# Patient Record
Sex: Female | Born: 1970 | Race: White | Hispanic: No | Marital: Married | Smoking: Never smoker
Health system: Southern US, Community
[De-identification: ages and names within clinical notes are randomized; demographics above are authoritative.]

## PROBLEM LIST (undated history)

## (undated) DIAGNOSIS — F419 Anxiety disorder, unspecified: Secondary | ICD-10-CM

## (undated) DIAGNOSIS — B002 Herpesviral gingivostomatitis and pharyngotonsillitis: Secondary | ICD-10-CM

## (undated) DIAGNOSIS — D649 Anemia, unspecified: Secondary | ICD-10-CM

## (undated) DIAGNOSIS — T7840XA Allergy, unspecified, initial encounter: Secondary | ICD-10-CM

## (undated) DIAGNOSIS — G4700 Insomnia, unspecified: Secondary | ICD-10-CM

## (undated) DIAGNOSIS — E669 Obesity, unspecified: Secondary | ICD-10-CM

## (undated) DIAGNOSIS — J45909 Unspecified asthma, uncomplicated: Secondary | ICD-10-CM

## (undated) HISTORY — DX: Insomnia, unspecified: G47.00

## (undated) HISTORY — DX: Anxiety disorder, unspecified: F41.9

## (undated) HISTORY — PX: CHOLECYSTECTOMY: SHX55

## (undated) HISTORY — PX: APPENDECTOMY: SHX54

## (undated) HISTORY — DX: Obesity, unspecified: E66.9

## (undated) HISTORY — DX: Herpesviral gingivostomatitis and pharyngotonsillitis: B00.2

## (undated) HISTORY — PX: BILATERAL SALPINGOOPHORECTOMY: SHX1223

## (undated) HISTORY — DX: Unspecified asthma, uncomplicated: J45.909

## (undated) HISTORY — PX: TONSILLECTOMY: SUR1361

## (undated) HISTORY — PX: ADENOIDECTOMY: SUR15

## (undated) HISTORY — PX: OTHER SURGICAL HISTORY: SHX169

## (undated) HISTORY — PX: COLONOSCOPY: SHX174

## (undated) HISTORY — DX: Allergy, unspecified, initial encounter: T78.40XA

---

## 2019-04-15 ENCOUNTER — Other Ambulatory Visit: Payer: Self-pay | Admitting: Obstetrics and Gynecology

## 2019-04-15 DIAGNOSIS — Z1231 Encounter for screening mammogram for malignant neoplasm of breast: Secondary | ICD-10-CM

## 2019-04-20 ENCOUNTER — Ambulatory Visit (HOSPITAL_COMMUNITY)
Admission: RE | Admit: 2019-04-20 | Discharge: 2019-04-20 | Disposition: A | Discharge status: Home / Self Care | Payer: BC Managed Care – PPO | Source: Ambulatory Visit | Attending: Physician Assistant | Admitting: Physician Assistant

## 2019-04-20 ENCOUNTER — Ambulatory Visit
Admission: EM | Admit: 2019-04-20 | Discharge: 2019-04-20 | Disposition: A | Discharge status: Home / Self Care | Payer: BC Managed Care – PPO

## 2019-04-20 ENCOUNTER — Ambulatory Visit
Admission: RE | Admit: 2019-04-20 | Discharge: 2019-04-20 | Disposition: A | Discharge status: Home / Self Care | Payer: BC Managed Care – PPO | Source: Ambulatory Visit | Attending: Obstetrics and Gynecology | Admitting: Obstetrics and Gynecology

## 2019-04-20 ENCOUNTER — Other Ambulatory Visit: Payer: Self-pay

## 2019-04-20 DIAGNOSIS — M79609 Pain in unspecified limb: Secondary | ICD-10-CM

## 2019-04-20 DIAGNOSIS — M25561 Pain in right knee: Secondary | ICD-10-CM | POA: Diagnosis present

## 2019-04-20 DIAGNOSIS — Z1231 Encounter for screening mammogram for malignant neoplasm of breast: Secondary | ICD-10-CM | POA: Insufficient documentation

## 2019-04-20 HISTORY — DX: Anemia, unspecified: D64.9

## 2019-04-20 MED ORDER — METHOCARBAMOL 500 MG PO TABS: 500.0000 mg | ORAL_TABLET | Freq: Two times a day (BID) | ORAL | 0 refills | Status: DC

## 2019-04-20 NOTE — Progress Notes (Signed)
Lower extremity venous has been completed.   Preliminary results in CV Proc.   Abram Sander 04/20/2019 4:10 PM

## 2019-04-20 NOTE — ED Provider Notes (Signed)
[facility]    CSN: [id number] Arrival date & time: 04/20/19  1100      History   Chief Complaint Chief Complaint  Patient presents with   Knee Pain    HPI Brittney [name] is a 49 y.o. female.   49 year old female comes in for 5 day history of atraumatic right knee pain. States pain is to the posterior right knee that radiates up the posterior thigh. Area is tender to palpation. States received J&J COVID vaccine 04/10/2019, saw today on the news about DVT from vaccine and wanted to make sure she did not have one.  Denies injury/trauma.  Denies swelling, erythema, warmth.  Denies chest pain, shortness of breath.  Denies hormone use/OCPs, long travels/immobility.  Denies recent surgeries.  Denies history of blood clots.  Never smoker.     Past Medical History:  Diagnosis Date   Anemia     There are no problems to display for this patient.   History reviewed. No pertinent surgical history.  OB History   No obstetric history on file.      Home Medications    Prior to Admission medications   Medication Sig Start Date End Date Taking? Authorizing Provider  loratadine (CLARITIN) 10 MG tablet Take 10 mg by mouth daily.   Yes [provider]  Multiple Vitamin (MULTIVITAMIN) capsule Take 1 capsule by mouth daily.   Yes [provider]  methocarbamol (ROBAXIN) 500 MG tablet Take 1 tablet (500 mg total) by mouth 2 (two) times daily. 04/20/19   Ok Edwards, PA-C    Family History History reviewed. No pertinent family history.  Social History Social History   Tobacco Use   Smoking status: Never Smoker   Smokeless tobacco: Never Used  Substance Use Topics   Alcohol use: Not Currently   Drug use: Never     Allergies   Peanut-containing drug products and Sulfa antibiotics   Review of Systems Review of Systems  Reason unable to perform ROS: See HPI as above.     Physical Exam Triage Vital Signs ED Triage Vitals  Enc Vitals Group      BP 04/20/19 1108 110/74     Pulse Rate 04/20/19 1108 80     Resp 04/20/19 1108 18     Temp 04/20/19 1108 98 F (36.7 C)     Temp Source 04/20/19 1108 Oral     SpO2 04/20/19 1108 98 %     Weight --      Height --      Head Circumference --      Peak Flow --      Pain Score 04/20/19 1116 4     Pain Loc --      Pain Edu? --      Excl. in [place]? --    No data found.  Updated Vital Signs BP 110/74 (BP Location: Left Arm)    Pulse 80    Temp 98 F (36.7 C) (Oral)    Resp 18    LMP 04/19/2019    SpO2 98%   Physical Exam Constitutional:      General: She is not in acute distress.    Appearance: Normal appearance. She is well-developed. She is not toxic-appearing or diaphoretic.  HENT:     Head: Normocephalic and atraumatic.  Eyes:     Conjunctiva/sclera: Conjunctivae normal.     Pupils: Pupils are equal, round, and reactive to light.  Pulmonary:     Effort: Pulmonary effort  is normal. No respiratory distress.     Comments: Speaking in full sentences without difficulty Musculoskeletal:     Cervical back: Normal range of motion and neck supple.     Comments: No swelling, erythema, warmth.  Tenderness to palpation of posterior right knee.  No cords felt.  Full range of motion of knee and hips.  Strength 5/5 BLE. Sensation intact.   Skin:    General: Skin is warm and dry.  Neurological:     Mental Status: She is alert and oriented to person, place, and time.      UC Treatments / Results  Labs (all labs ordered are listed, but only abnormal results are displayed) Labs Reviewed - No data to display  EKG   Radiology LE VENOUS  Result Date: 04/20/2019  Lower Venous DVTStudy Indications: Pain, and Swelling.  Comparison Study: no prior Performing Technologist: Abram Sander RVS  Examination Guidelines: A complete evaluation includes B-mode imaging, spectral Doppler, color Doppler, and power Doppler as needed of all accessible portions of each vessel. Bilateral testing is  considered an integral part of a complete examination. Limited examinations for reoccurring indications may be performed as noted. The reflux portion of the exam is performed with the patient in reverse Trendelenburg.  +---------+---------------+---------+-----------+----------+--------------+  RIGHT     Compressibility Phasicity Spontaneity Properties Thrombus Aging  +---------+---------------+---------+-----------+----------+--------------+  CFV       Full            Yes       Yes                                    +---------+---------------+---------+-----------+----------+--------------+  SFJ       Full                                                             +---------+---------------+---------+-----------+----------+--------------+  FV Prox   Full                                                             +---------+---------------+---------+-----------+----------+--------------+  FV Mid    Full                                                             +---------+---------------+---------+-----------+----------+--------------+  FV Distal Full                                                             +---------+---------------+---------+-----------+----------+--------------+  PFV       Full                                                             +---------+---------------+---------+-----------+----------+--------------+  POP       Full            Yes       Yes                                    +---------+---------------+---------+-----------+----------+--------------+  PTV       Full                                                             +---------+---------------+---------+-----------+----------+--------------+  PERO      Full                                                             +---------+---------------+---------+-----------+----------+--------------+   +----+---------------+---------+-----------+----------+--------------+   LEFT Compressibility Phasicity Spontaneity Properties Thrombus Aging  +----+---------------+---------+-----------+----------+--------------+  CFV  Full            Yes       Yes                                    +----+---------------+---------+-----------+----------+--------------+     Summary: RIGHT: - There is no evidence of deep vein thrombosis in the lower extremity.  - No cystic structure found in the popliteal fossa.  LEFT: - No evidence of common femoral vein obstruction.  *See table(s) above for measurements and observations.    Preliminary     Procedures Procedures (including critical care time)  Medications Ordered in UC Medications - No data to display  Initial Impression / Assessment and Plan / UC Course  I have reviewed the triage vital signs and the nursing notes.  Pertinent labs & imaging results that were available during my care of the patient were reviewed by me and considered in my medical decision making (see chart for details).    Well's score of 1. Will order DVT study. Otherwise, will treat symptomatically for possible strain/tendinitis. NSAIDs, muscle relaxant as directed. Return precautions given. Patient expresses understanding and agrees to plan. Patient discharged in stable condition pending DVT study appt this afternoon.  DVT study negative. Will proceed with above plan.  Final Clinical Impressions(s) / UC Diagnoses   Final diagnoses:  Posterior knee pain, right    ED Prescriptions    Medication Sig Dispense Auth. Provider   methocarbamol (ROBAXIN) 500 MG tablet Take 1 tablet (500 mg total) by mouth 2 (two) times daily. 20 tablet Ok Edwards, PA-C     PDMP not reviewed this encounter.   Ok Edwards, PA-C 04/20/19 1900

## 2019-04-20 NOTE — ED Notes (Signed)
Pt notified she has an appt at 4pm at [facility] heart/vascular for a DVT study to rt leg.

## 2019-04-20 NOTE — Discharge Instructions (Signed)
DVT study ordered. Please follow up for ultrasound. Take ibuprofen 800mg  three times a day for the next 3-5 days. Robaxin as needed, this can make you drowsy, so do not take if you are going to drive, operate heavy machinery, or make important decisions. Ice/heat compresses as needed. Follow up with PCP/orthopedics for further evaluation if symptoms not improving.

## 2019-04-20 NOTE — ED Triage Notes (Signed)
Pt c/o pain behind rt knee radiating up back of thigh x5 days ago. Pt c/o headache off and on x1wk. Pt states received johnson and johnson vaccine 04/10/19.

## 2019-04-29 ENCOUNTER — Other Ambulatory Visit: Payer: Self-pay | Admitting: Obstetrics and Gynecology

## 2019-04-29 DIAGNOSIS — R921 Mammographic calcification found on diagnostic imaging of breast: Secondary | ICD-10-CM

## 2019-04-29 DIAGNOSIS — R928 Other abnormal and inconclusive findings on diagnostic imaging of breast: Secondary | ICD-10-CM

## 2019-05-10 ENCOUNTER — Ambulatory Visit
Admission: RE | Admit: 2019-05-10 | Discharge: 2019-05-10 | Disposition: A | Discharge status: Home / Self Care | Payer: BC Managed Care – PPO | Source: Ambulatory Visit | Attending: Obstetrics and Gynecology | Admitting: Obstetrics and Gynecology

## 2019-05-10 DIAGNOSIS — R921 Mammographic calcification found on diagnostic imaging of breast: Secondary | ICD-10-CM | POA: Insufficient documentation

## 2019-05-10 DIAGNOSIS — R928 Other abnormal and inconclusive findings on diagnostic imaging of breast: Secondary | ICD-10-CM

## 2019-09-21 ENCOUNTER — Ambulatory Visit
Admission: EM | Admit: 2019-09-21 | Discharge: 2019-09-21 | Disposition: A | Discharge status: Home / Self Care | Payer: BC Managed Care – PPO | Attending: Family Medicine | Admitting: Family Medicine

## 2019-09-21 ENCOUNTER — Other Ambulatory Visit: Payer: Self-pay

## 2019-09-21 DIAGNOSIS — M545 Low back pain, unspecified: Secondary | ICD-10-CM

## 2019-09-21 LAB — POCT URINALYSIS DIP (MANUAL ENTRY)
Bilirubin, UA: NEGATIVE
Glucose, UA: NEGATIVE mg/dL
Ketones, POC UA: NEGATIVE mg/dL
Leukocytes, UA: NEGATIVE
Nitrite, UA: NEGATIVE
Protein Ur, POC: NEGATIVE mg/dL
Spec Grav, UA: 1.01 (ref 1.010–1.025)
Urobilinogen, UA: 0.2 E.U./dL
pH, UA: 6.5 (ref 5.0–8.0)

## 2019-09-21 MED ORDER — CYCLOBENZAPRINE HCL 10 MG PO TABS
10.0000 mg | ORAL_TABLET | Freq: Two times a day (BID) | ORAL | 0 refills | Status: DC | PRN
Start: 2019-09-21 — End: 2021-03-07

## 2019-09-21 NOTE — ED Triage Notes (Signed)
Pt c/o lt flank pain since Sunday. Taken NSAIDS with relief. States having a burning feeling now to lt flank. Denies difficulty urinating.

## 2019-09-21 NOTE — ED Provider Notes (Signed)
[facility]    CSN: [id number] Arrival date & time: 09/21/19  0912      History   Chief Complaint Chief Complaint  Patient presents with  . Flank Pain    HPI Brittney [name] is a 49 y.o. female.   Patient complains of left sided low back pain.  She denies any urinary symptoms such as frequency or dysuria.  She denies injury or lifting.  There is no radiation of the pain to the leg.  Pain is made worse with certain movements.  HPI  Past Medical History:  Diagnosis Date  . Anemia     There are no problems to display for this patient.   History reviewed. No pertinent surgical history.  OB History   No obstetric history on file.      Home Medications    Prior to Admission medications   Medication Sig Start Date End Date Taking? Authorizing Provider  loratadine (CLARITIN) 10 MG tablet Take 10 mg by mouth daily.    [provider]  Multiple Vitamin (MULTIVITAMIN) capsule Take 1 capsule by mouth daily.    [provider]    Family History No family history on file.  Social History Social History   Tobacco Use  . Smoking status: Never Smoker  . Smokeless tobacco: Never Used  Substance Use Topics  . Alcohol use: Not Currently  . Drug use: Never     Allergies   Peanut-containing drug products and Sulfa antibiotics   Review of Systems Review of Systems  Constitutional: Negative.   Genitourinary: Positive for flank pain. Negative for dysuria, frequency, hematuria and urgency.  Musculoskeletal: Positive for back pain.  All other systems reviewed and are negative.    Physical Exam Triage Vital Signs ED Triage Vitals  Enc Vitals Group     BP 09/21/19 1040 131/75     Pulse Rate 09/21/19 1040 87     Resp 09/21/19 1040 18     Temp 09/21/19 1040 98.6 F (37 C)     Temp Source 09/21/19 1040 Oral     SpO2 09/21/19 1040 98 %     Weight --      Height --      Head Circumference --      Peak Flow --      Pain Score 09/21/19  1041 5     Pain Loc --      Pain Edu? --      Excl. in [place]? --    No data found.  Updated Vital Signs BP 131/75 (BP Location: Left Arm)   Pulse 87   Temp 98.6 F (37 C) (Oral)   Resp 18   LMP 09/07/2019   SpO2 98%   Visual Acuity Right Eye Distance:   Left Eye Distance:   Bilateral Distance:    Right Eye Near:   Left Eye Near:    Bilateral Near:     Physical Exam Vitals and nursing note reviewed.  Constitutional:      Appearance: Normal appearance.  Cardiovascular:     Rate and Rhythm: Normal rate and regular rhythm.  Pulmonary:     Effort: Pulmonary effort is normal.     Breath sounds: Normal breath sounds.  Musculoskeletal:     Comments: Back: Normal range of motion but pain increases with lateral bending away from the side of pain suggesting muscular etiology  Neurological:     Mental Status: She is alert.      UC Treatments / Results  Labs (all labs ordered are listed, but only abnormal results are displayed) Labs Reviewed  POCT URINALYSIS DIP (MANUAL ENTRY)    EKG   Radiology No results found.  Procedures Procedures (including critical care time)  Medications Ordered in UC Medications - No data to display  Initial Impression / Assessment and Plan / UC Course  I have reviewed the triage vital signs and the nursing notes.  Pertinent labs & imaging results that were available during my care of the patient were reviewed by me and considered in my medical decision making (see chart for details).     Flank pain, probably muscular.  She does have some red cells in urine that will be cultured.  Will treat with muscle relaxant and advised to push fluids Final Clinical Impressions(s) / UC Diagnoses   Final diagnoses:  None   Discharge Instructions   None    ED Prescriptions    None     PDMP not reviewed this encounter.   Wardell Honour, MD 09/21/19 1105

## 2019-09-22 LAB — URINE CULTURE: Culture: <10000 — AB

## 2019-11-05 ENCOUNTER — Ambulatory Visit (INDEPENDENT_AMBULATORY_CARE_PROVIDER_SITE_OTHER): Payer: BC Managed Care – PPO | Admitting: Internal Medicine

## 2019-11-05 ENCOUNTER — Encounter: Payer: Self-pay | Admitting: Internal Medicine

## 2019-11-05 ENCOUNTER — Other Ambulatory Visit: Payer: Self-pay

## 2019-11-05 VITALS — BP 120/80 | HR 73 | Temp 97.7°F | Ht 67.0 in | Wt 248.5 lb

## 2019-11-05 DIAGNOSIS — T753XXD Motion sickness, subsequent encounter: Secondary | ICD-10-CM

## 2019-11-05 DIAGNOSIS — G4700 Insomnia, unspecified: Secondary | ICD-10-CM | POA: Insufficient documentation

## 2019-11-05 DIAGNOSIS — D649 Anemia, unspecified: Secondary | ICD-10-CM | POA: Insufficient documentation

## 2019-11-05 DIAGNOSIS — Z1211 Encounter for screening for malignant neoplasm of colon: Secondary | ICD-10-CM

## 2019-11-05 DIAGNOSIS — J45909 Unspecified asthma, uncomplicated: Secondary | ICD-10-CM | POA: Insufficient documentation

## 2019-11-05 DIAGNOSIS — J4520 Mild intermittent asthma, uncomplicated: Secondary | ICD-10-CM | POA: Diagnosis not present

## 2019-11-05 DIAGNOSIS — E669 Obesity, unspecified: Secondary | ICD-10-CM | POA: Insufficient documentation

## 2019-11-05 DIAGNOSIS — B002 Herpesviral gingivostomatitis and pharyngotonsillitis: Secondary | ICD-10-CM | POA: Insufficient documentation

## 2019-11-05 MED ORDER — SCOPOLAMINE 1 MG/3DAYS TD PT72: 1.0000 | MEDICATED_PATCH | TRANSDERMAL | 0 refills | Status: DC

## 2019-11-05 MED ORDER — ALPRAZOLAM 0.5 MG PO TABS: 0.5000 mg | ORAL_TABLET | Freq: Every day | ORAL | 0 refills | Status: DC | PRN

## 2019-11-05 MED ORDER — VALACYCLOVIR HCL 1 G PO TABS: 1000.0000 mg | ORAL_TABLET | Freq: Every day | ORAL | 0 refills | Status: DC | PRN

## 2019-11-05 NOTE — Progress Notes (Signed)
New Patient Office Visit     This visit occurred during the SARS-CoV-2 public health emergency.  Safety protocols were in place, including screening questions prior to the visit, additional usage of staff PPE, and extensive cleaning of exam room while observing appropriate contact time as indicated for disinfecting solutions.    CC/Reason for Visit: Establish care, discuss chronic conditions, medication refills Previous PCP: In [place] Last Visit: 2 years ago  HPI: Brittney [name] is a 49 y.o. female who is coming in today for the above mentioned reasons. 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  She has a gynecologist and had her Pap smear in January 2021.  She is overdue for screening colonoscopy.  She had a mammogram in May 2021.  She recently had her flu vaccine.  She is due for her Covid booster, Tdap, shingles.  Because of her asthma she gets pneumonia vaccines every 5 years and is due in 2022.  She is going on a trip soon and would like a refill of scopolamine patches that she uses for seasickness.  She has no acute complaints today.  She does not smoke, she does not drink, her past surgical history significant for a cholecystectomy, lap band surgery and tonsillectomy's.   Past Medical/Surgical History: Past Medical History:  Diagnosis Date   Anemia    Asthma    Insomnia    Obesity (BMI 35.0-39.9 without comorbidity)    Oral herpes     Past Surgical History:  Procedure Laterality Date   CHOLECYSTECTOMY     lapband     TONSILLECTOMY      Social History:  reports that she has never smoked. She has never used smokeless tobacco. She reports current alcohol use. She reports that she does not use  drugs.  Allergies: Allergies  Allergen Reactions   Peanut-Containing Drug Products Hives   Sulfa Antibiotics Nausea And Vomiting    Family History:  No history of stroke, cancer, heart disease that she is aware of.  Current Outpatient Medications:    ALPRAZolam (XANAX) 0.5 MG tablet, Take 1 tablet (0.5 mg total) by mouth daily as needed., Disp: 30 tablet, Rfl: 0   cyclobenzaprine (FLEXERIL) 10 MG tablet, Take 1 tablet (10 mg total) by mouth 2 (two) times daily as needed for muscle spasms., Disp: 20 tablet, Rfl: 0   loratadine (CLARITIN) 10 MG tablet, Take 10 mg by mouth daily., Disp: , Rfl:    Multiple Vitamin (MULTIVITAMIN) capsule, Take 1 capsule by mouth daily., Disp: , Rfl:    scopolamine (TRANSDERM-SCOP, 1.5 MG,) 1 MG/3DAYS, Place 1 patch (1.5 mg total) onto the skin every 3 (three) days., Disp: 10 patch, Rfl: 0   valACYclovir (VALTREX) 1000 MG tablet, Take 1 tablet (1,000 mg total) by mouth daily as needed., Disp: 30 tablet, Rfl: 0  Review of Systems:  Constitutional: Denies fever, chills, diaphoresis, appetite change and fatigue.  HEENT: Denies photophobia, eye pain, redness, hearing loss, ear pain, congestion, sore throat, rhinorrhea, sneezing, mouth sores, trouble swallowing, neck pain, neck stiffness and tinnitus.   Respiratory: Denies SOB, DOE, cough, chest tightness,  and wheezing.   Cardiovascular: Denies chest pain, palpitations and leg swelling.  Gastrointestinal: Denies nausea, vomiting, abdominal pain, diarrhea, constipation, blood in stool and abdominal distention.  Genitourinary: Denies dysuria, urgency, frequency, hematuria, flank pain and difficulty urinating.  Endocrine: Denies: hot or cold intolerance, sweats, changes in hair or nails, polyuria, polydipsia. Musculoskeletal: Denies myalgias, back pain, joint swelling, arthralgias and gait problem.  Skin: Denies pallor, rash and wound.  Neurological: Denies dizziness, seizures, syncope, weakness,  light-headedness, numbness and headaches.  Hematological: Denies adenopathy. Easy bruising, personal or family bleeding history  Psychiatric/Behavioral: Denies suicidal ideation, mood changes, confusion, nervousness, sleep disturbance and agitation    Physical Exam: Vitals:   11/05/19 1337  BP: 120/80  Pulse: 73  Temp: 97.7 F (36.5 C)  TempSrc: Oral  SpO2: 96%  Weight: 248 lb 8 oz (112.7 kg)  Height: 5\' 7"  (1.702 m)   Body mass index is 38.92 kg/m.  Constitutional: NAD, calm, comfortable Eyes: PERRL, lids and conjunctivae normal ENMT: Mucous membranes are moist.  Respiratory: clear to auscultation bilaterally, no wheezing, no crackles. Normal respiratory effort. No accessory muscle use.  Cardiovascular: Regular rate and rhythm, no murmurs / rubs / gallops. No extremity edema. Neurologic: Grossly intact and nonfocal Psychiatric: Normal judgment and insight. Alert and oriented x 3. Normal mood.    Impression and Plan:  Screening for malignant neoplasm of colon  - Plan: Ambulatory referral to Gastroenterology  Obesity (BMI 35.0-39.9 without comorbidity) -Discussed healthy lifestyle, including increased physical activity and better food choices to promote weight loss. -She is status post lap band surgery and plans to follow with her surgeon in [place] for this.  Insomnia, unspecified type  - Plan: ALPRAZolam (XANAX) 0.5 MG tablet to take at nighttime as needed.  30 tablets prescribed today.  Oral herpes  - Plan: valACYclovir (VALTREX) 1000 MG tablet to take daily as needed  Mild intermittent asthma without complication -Not on medications, no recent outbreaks.  Sea sickness, subsequent encounter  - Plan: scopolamine (TRANSDERM-SCOP, 1.5 MG,) 1 MG/3DAYS    Patient Instructions  -Nice seeing you today!!  -Schedule follow up for physical in 3-4 months. Please come in fasting that day.     Lelon Frohlich, MD [facility]

## 2019-11-05 NOTE — Patient Instructions (Signed)
-  Nice seeing you today!!  -Schedule follow up for physical in 3-4 months. Please come in fasting that day.

## 2019-11-29 ENCOUNTER — Other Ambulatory Visit: Payer: Self-pay | Admitting: Internal Medicine

## 2019-11-29 DIAGNOSIS — B002 Herpesviral gingivostomatitis and pharyngotonsillitis: Secondary | ICD-10-CM

## 2019-12-13 ENCOUNTER — Other Ambulatory Visit: Payer: Self-pay | Admitting: Internal Medicine

## 2019-12-13 DIAGNOSIS — B002 Herpesviral gingivostomatitis and pharyngotonsillitis: Secondary | ICD-10-CM

## 2020-02-07 ENCOUNTER — Other Ambulatory Visit: Payer: Self-pay

## 2020-02-07 ENCOUNTER — Ambulatory Visit (AMBULATORY_SURGERY_CENTER): Payer: BC Managed Care – PPO | Admitting: *Deleted

## 2020-02-07 ENCOUNTER — Other Ambulatory Visit: Payer: Self-pay | Admitting: Internal Medicine

## 2020-02-07 VITALS — Ht 67.0 in | Wt 235.0 lb

## 2020-02-07 DIAGNOSIS — Z1211 Encounter for screening for malignant neoplasm of colon: Secondary | ICD-10-CM

## 2020-02-07 MED ORDER — NA SULFATE-K SULFATE-MG SULF 17.5-3.13-1.6 GM/177ML PO SOLN: ORAL | 0 refills | Status: DC

## 2020-02-07 NOTE — Progress Notes (Signed)
Patient's pre-visit was done today over the phone with the patient due to COVID-19 pandemic. Name,DOB and address verified. Insurance verified. Patient denies any allergies to Eggs and Soy. Patient denies any problems with anesthesia/sedation. Patient denies taking diet pills or blood thinners. Packet of Prep instructions mailed to patient including a copy of a consent form and pre-procedure patient acknowledgement form (with envelope to mail back to us)-pt is aware. suprep Coupon included. Patient understands to call us back with any questions or concerns. COVID-19 vaccines completed on 11/07/19 x3, per patient. Patient is aware of our care-partner policy and [id number] safety protocol. EMMI education assigned to the patient for the procedure, sent to [facility].

## 2020-02-10 ENCOUNTER — Encounter: Payer: BC Managed Care – PPO | Admitting: Internal Medicine

## 2020-02-14 DIAGNOSIS — Z1211 Encounter for screening for malignant neoplasm of colon: Secondary | ICD-10-CM

## 2020-02-15 ENCOUNTER — Encounter: Payer: BC Managed Care – PPO | Admitting: Internal Medicine

## 2020-02-16 MED ORDER — PLENVU 140 G PO SOLR: 1.0000 | Freq: Once | ORAL | 0 refills | Status: AC

## 2020-02-16 NOTE — Telephone Encounter (Signed)
Spoke with the patient. suprep is $127. Switched to plenvu, new prep instructions sent to mychart and new RX for plenvu with coupon sent to [facility]. Pt is aware.

## 2020-02-23 ENCOUNTER — Encounter: Payer: BC Managed Care – PPO | Admitting: Internal Medicine

## 2020-02-23 ENCOUNTER — Encounter: Payer: Self-pay | Admitting: Internal Medicine

## 2020-02-23 ENCOUNTER — Other Ambulatory Visit: Payer: Self-pay | Admitting: Obstetrics and Gynecology

## 2020-02-23 ENCOUNTER — Other Ambulatory Visit: Payer: Self-pay

## 2020-02-23 DIAGNOSIS — Z1231 Encounter for screening mammogram for malignant neoplasm of breast: Secondary | ICD-10-CM

## 2020-02-24 ENCOUNTER — Encounter: Payer: Self-pay | Admitting: Internal Medicine

## 2020-02-24 ENCOUNTER — Other Ambulatory Visit (INDEPENDENT_AMBULATORY_CARE_PROVIDER_SITE_OTHER): Payer: BC Managed Care – PPO

## 2020-02-24 ENCOUNTER — Other Ambulatory Visit: Payer: Self-pay | Admitting: Internal Medicine

## 2020-02-24 ENCOUNTER — Encounter: Payer: BC Managed Care – PPO | Admitting: Internal Medicine

## 2020-02-24 ENCOUNTER — Other Ambulatory Visit: Payer: Self-pay | Admitting: *Deleted

## 2020-02-24 DIAGNOSIS — D649 Anemia, unspecified: Secondary | ICD-10-CM

## 2020-02-24 DIAGNOSIS — G4700 Insomnia, unspecified: Secondary | ICD-10-CM

## 2020-02-24 DIAGNOSIS — R5383 Other fatigue: Secondary | ICD-10-CM

## 2020-02-24 DIAGNOSIS — E785 Hyperlipidemia, unspecified: Secondary | ICD-10-CM | POA: Insufficient documentation

## 2020-02-24 DIAGNOSIS — E669 Obesity, unspecified: Secondary | ICD-10-CM | POA: Diagnosis not present

## 2020-02-24 LAB — BASIC METABOLIC PANEL
BUN: 17 mg/dL (ref 6–23)
CO2: 30 mEq/L (ref 19–32)
Calcium: 9.5 mg/dL (ref 8.4–10.5)
Chloride: 101 mEq/L (ref 96–112)
Creatinine, Ser: 0.75 mg/dL (ref 0.40–1.20)
GFR: 92.86 mL/min (ref >60.00)
Glucose, Bld: 89 mg/dL (ref 70–99)
Potassium: 4.4 mEq/L (ref 3.5–5.1)
Sodium: 137 mEq/L (ref 135–145)

## 2020-02-24 LAB — CBC WITH DIFFERENTIAL/PLATELET
Basophils Absolute: 0 10*3/uL (ref 0.0–0.1)
Basophils Relative: 0.7 % (ref 0.0–3.0)
Eosinophils Absolute: 0.2 10*3/uL (ref 0.0–0.7)
Eosinophils Relative: 3.8 % (ref 0.0–5.0)
HCT: 39.4 % (ref 36.0–46.0)
Hemoglobin: 13.3 g/dL (ref 12.0–15.0)
Lymphocytes Relative: 32.4 % (ref 12.0–46.0)
Lymphs Abs: 1.9 10*3/uL (ref 0.7–4.0)
MCHC: 33.8 g/dL (ref 30.0–36.0)
MCV: 89.4 fl (ref 78.0–100.0)
Monocytes Absolute: 0.5 10*3/uL (ref 0.1–1.0)
Monocytes Relative: 9.1 % (ref 3.0–12.0)
Neutro Abs: 3.2 10*3/uL (ref 1.4–7.7)
Neutrophils Relative %: 54 % (ref 43.0–77.0)
Platelets: 275 10*3/uL (ref 150.0–400.0)
RBC: 4.4 Mil/uL (ref 3.87–5.11)
RDW: 13.3 % (ref 11.5–15.5)
WBC: 5.9 10*3/uL (ref 4.0–10.5)

## 2020-02-24 LAB — LIPID PANEL
Cholesterol: 239 mg/dL — ABNORMAL HIGH (ref 0–200)
HDL: 52.7 mg/dL (ref >39.00)
LDL Cholesterol: 159 mg/dL — ABNORMAL HIGH (ref 0–99)
NonHDL: 186.39
Total CHOL/HDL Ratio: 5
Triglycerides: 139 mg/dL (ref 0.0–149.0)
VLDL: 27.8 mg/dL (ref 0.0–40.0)

## 2020-02-24 LAB — HEMOGLOBIN A1C: Hgb A1c MFr Bld: 5.6 % (ref 4.6–6.5)

## 2020-02-24 LAB — TSH: TSH: 1.8 u[IU]/mL (ref 0.35–4.50)

## 2020-02-24 MED ORDER — ALPRAZOLAM 0.5 MG PO TABS: 0.5000 mg | ORAL_TABLET | Freq: Every day | ORAL | 0 refills | Status: DC | PRN

## 2020-02-24 NOTE — Addendum Note (Signed)
Addended by: Marrion Coy on: 02/24/2020 07:05 AM   Modules accepted: Orders

## 2020-02-29 ENCOUNTER — Other Ambulatory Visit: Payer: Self-pay

## 2020-02-29 ENCOUNTER — Encounter: Payer: Self-pay | Admitting: Internal Medicine

## 2020-02-29 ENCOUNTER — Ambulatory Visit (AMBULATORY_SURGERY_CENTER): Payer: BC Managed Care – PPO | Admitting: Internal Medicine

## 2020-02-29 VITALS — BP 101/62 | HR 61 | Temp 96.2°F | Resp 16 | Ht 67.0 in | Wt 235.0 lb

## 2020-02-29 DIAGNOSIS — Z1211 Encounter for screening for malignant neoplasm of colon: Secondary | ICD-10-CM

## 2020-02-29 DIAGNOSIS — D123 Benign neoplasm of transverse colon: Secondary | ICD-10-CM

## 2020-02-29 DIAGNOSIS — D122 Benign neoplasm of ascending colon: Secondary | ICD-10-CM

## 2020-02-29 MED ORDER — SODIUM CHLORIDE 0.9 % IV SOLN
500.0000 mL | Freq: Once | INTRAVENOUS | Status: DC
Start: 2020-02-29 — End: 2020-02-29

## 2020-02-29 NOTE — Progress Notes (Signed)
PT taken to PACU. Monitors in place. VSS. Report given to RN. 

## 2020-02-29 NOTE — Progress Notes (Signed)
Vs by CW in adm  Pt's states no medical or surgical changes since previsit or office visit.     

## 2020-02-29 NOTE — Progress Notes (Signed)
Called to room to assist during endoscopic procedure.  Patient ID and intended procedure confirmed with present staff. Received instructions for my participation in the procedure from the performing physician.  

## 2020-02-29 NOTE — Patient Instructions (Signed)
Please read handouts provided. Continue present medications. Await pathology results. Repeat colonoscopy in 1 year for surveillance.     YOU HAD AN ENDOSCOPIC PROCEDURE TODAY AT THE [facility]:   Refer to the procedure report that was given to you for any specific questions about what was found during the examination.  If the procedure report does not answer your questions, please call your gastroenterologist to clarify.  If you requested that your care partner not be given the details of your procedure findings, then the procedure report has been included in a sealed envelope for you to review at your convenience later.  YOU SHOULD EXPECT: Some feelings of bloating in the abdomen. Passage of more gas than usual.  Walking can help get rid of the air that was put into your GI tract during the procedure and reduce the bloating. If you had a lower endoscopy (such as a colonoscopy or flexible sigmoidoscopy) you may notice spotting of blood in your stool or on the toilet paper. If you underwent a bowel prep for your procedure, you may not have a normal bowel movement for a few days.  Please Note:  You might notice some irritation and congestion in your nose or some drainage.  This is from the oxygen used during your procedure.  There is no need for concern and it should clear up in a day or so.  SYMPTOMS TO REPORT IMMEDIATELY:   Following lower endoscopy (colonoscopy or flexible sigmoidoscopy):  Excessive amounts of blood in the stool  Significant tenderness or worsening of abdominal pains  Swelling of the abdomen that is new, acute  Fever of 100F or higher    For urgent or emergent issues, a gastroenterologist can be reached at any hour by calling [phone]. Do not use MyChart messaging for urgent concerns.    DIET:  We do recommend a small meal at first, but then you may proceed to your regular diet.  Drink plenty of fluids but you should avoid alcoholic beverages for 24  hours.  ACTIVITY:  You should plan to take it easy for the rest of today and you should NOT DRIVE or use heavy machinery until tomorrow (because of the sedation medicines used during the test).    FOLLOW UP: Our staff will call the number listed on your records 48-72 hours following your procedure to check on you and address any questions or concerns that you may have regarding the information given to you following your procedure. If we do not reach you, we will leave a message.  We will attempt to reach you two times.  During this call, we will ask if you have developed any symptoms of COVID 19. If you develop any symptoms (ie: fever, flu-like symptoms, shortness of breath, cough etc.) before then, please call [phone].  If you test positive for Covid 19 in the 2 weeks post procedure, please call and report this information to us.    If any biopsies were taken you will be contacted by phone or by letter within the next 1-3 weeks.  Please call us at [phone] if you have not heard about the biopsies in 3 weeks.    SIGNATURES/CONFIDENTIALITY: You and/or your care partner have signed paperwork which will be entered into your electronic medical record.  These signatures attest to the fact that that the information above on your After Visit Summary has been reviewed and is understood.  Full responsibility of the confidentiality of this discharge information lies with   and/or your care-partner. 

## 2020-02-29 NOTE — Op Note (Signed)
[facility] Patient Name: Brittney [name] Procedure Date: 02/29/2020 8:46 AM MRN: [id number] Endoscopist: Docia Chuck. Henrene Pastor , MD Age: 50 Referring MD:  Date of Birth: 1970-11-27 Gender: Female Account #: [id number] Procedure:                Colonoscopy hot snare EMR polypectomy; cold                            polypectomy; submuc. injection Indications:              Screening for colorectal malignant neoplasm Medicines:                Monitored Anesthesia Care Procedure:                Pre-Anesthesia Assessment:                           - Prior to the procedure, a History and Physical                            was performed, and patient medications and                            allergies were reviewed. The patient's tolerance of                            previous anesthesia was also reviewed. The risks                            and benefits of the procedure and the sedation                            options and risks were discussed with the patient.                            All questions were answered, and informed consent                            was obtained. Prior Anticoagulants: The patient has                            taken no previous anticoagulant or antiplatelet                            agents. ASA Grade Assessment: I - A normal, healthy                            patient. After reviewing the risks and benefits,                            the patient was deemed in satisfactory condition to                            undergo the procedure.  After obtaining informed consent, the colonoscope                            was passed under direct vision. Throughout the                            procedure, the patient's blood pressure, pulse, and                            oxygen saturations were monitored continuously. The                            Olympus CF-HQ190 [id number]) Colonoscope was                            introduced through the  anus and advanced to the the                            cecum, identified by appendiceal orifice and                            ileocecal valve. The ileocecal valve, appendiceal                            orifice, and rectum were photographed. The quality                            of the bowel preparation was excellent. The                            colonoscopy was performed without difficulty. The                            patient tolerated the procedure well. The bowel                            preparation used was Plenvu via split dose                            instruction. Scope In: 9:17:11 AM Scope Out: 9:40:39 AM Scope Withdrawal Time: 0 hours 20 minutes 19 seconds  Total Procedure Duration: 0 hours 23 minutes 28 seconds  Findings:                 A 20 mm polyp was found in the ascending colon. The                            polyp was sessile. The polyp was removed with a                            saline injection-lift technique using a hot snare.                            Resection and retrieval were complete. Area was  successfully injected with 3 mL Spot (carbon black)                            for tattooing.                           A 3 mm polyp was found in the transverse colon. The                            polyp was sessile. The polyp was removed with a                            cold snare. Resection and retrieval were complete.                           Multiple diverticula were found in the left colon.                           The exam was otherwise without abnormality on                            direct and retroflexion views. Complications:            No immediate complications. Estimated blood loss:                            None. Estimated Blood Loss:     Estimated blood loss: none. Impression:               - One 20 mm polyp in the ascending colon, removed                            using injection-lift and a hot snare. Resected  and                            retrieved. Injected.                           - One 3 mm polyp in the transverse colon, removed                            with a cold snare. Resected and retrieved.                           - Diverticulosis in the left colon.                           - The examination was otherwise normal on direct                            and retroflexion views. Recommendation:           - Repeat colonoscopy in 1 year for surveillance.                           -  Patient has a contact number available for                            emergencies. The signs and symptoms of potential                            delayed complications were discussed with the                            patient. Return to normal activities tomorrow.                            Written discharge instructions were provided to the                            patient.                           - Resume previous diet.                           - Continue present medications.                           - Await pathology results. Docia Chuck. Henrene Pastor, MD 02/29/2020 9:49:49 AM This report has been signed electronically.

## 2020-03-02 ENCOUNTER — Telehealth: Payer: Self-pay | Admitting: *Deleted

## 2020-03-02 NOTE — Telephone Encounter (Signed)
  Follow up Call-  Call back number 02/29/2020  Post procedure Call Back phone  # [phone]  Permission to leave phone message Yes     Patient questions:  Do you have a fever, pain , or abdominal swelling? No. Pain Score  0 *  Have you tolerated food without any problems? Yes.    Have you been able to return to your normal activities? Yes.    Do you have any questions about your discharge instructions: Diet   No. Medications  No. Follow up visit  No.  Do you have questions or concerns about your Care? No.  Actions: * If pain score is 4 or above: No action needed, pain <4.  1. Have you developed a fever since your procedure? no  2.   Have you had an respiratory symptoms (SOB or cough) since your procedure? no  3.   Have you tested positive for COVID 19 since your procedure no  4.   Have you had any family members/close contacts diagnosed with the COVID 19 since your procedure?  no   If yes to any of these questions please route to Joylene John, RN and Joella Prince, RN

## 2020-03-02 NOTE — Telephone Encounter (Signed)
No answer for post procedure followup call. Unable to leave message. 

## 2020-03-03 ENCOUNTER — Other Ambulatory Visit: Payer: Self-pay

## 2020-03-03 ENCOUNTER — Encounter: Payer: Self-pay | Admitting: Internal Medicine

## 2020-03-03 ENCOUNTER — Ambulatory Visit (INDEPENDENT_AMBULATORY_CARE_PROVIDER_SITE_OTHER): Payer: BC Managed Care – PPO | Admitting: Internal Medicine

## 2020-03-03 VITALS — BP 124/80 | HR 82 | Temp 98.5°F | Ht 67.0 in | Wt 243.7 lb

## 2020-03-03 DIAGNOSIS — E785 Hyperlipidemia, unspecified: Secondary | ICD-10-CM | POA: Diagnosis not present

## 2020-03-03 DIAGNOSIS — Z0000 Encounter for general adult medical examination without abnormal findings: Secondary | ICD-10-CM

## 2020-03-03 DIAGNOSIS — B002 Herpesviral gingivostomatitis and pharyngotonsillitis: Secondary | ICD-10-CM

## 2020-03-03 DIAGNOSIS — J4520 Mild intermittent asthma, uncomplicated: Secondary | ICD-10-CM

## 2020-03-03 DIAGNOSIS — G4700 Insomnia, unspecified: Secondary | ICD-10-CM

## 2020-03-03 DIAGNOSIS — K635 Polyp of colon: Secondary | ICD-10-CM

## 2020-03-03 DIAGNOSIS — E669 Obesity, unspecified: Secondary | ICD-10-CM

## 2020-03-03 DIAGNOSIS — Z1283 Encounter for screening for malignant neoplasm of skin: Secondary | ICD-10-CM

## 2020-03-03 NOTE — Progress Notes (Signed)
Established Patient Office Visit     This visit occurred during the SARS-CoV-2 public health emergency.  Safety protocols were in place, including screening questions prior to the visit, additional usage of staff PPE, and extensive cleaning of exam room while observing appropriate contact time as indicated for disinfecting solutions.    CC/Reason for Visit: Annual preventive exam  HPI: Brittney [name] is a 50 y.o. female who is coming in today for the above mentioned reasons. Past Medical History is significant for: Obesity, hyperlipidemia, well-controlled asthma, occasional oral herpes outbreaks, insomnia on as needed Xanax. She has no acute complaints today. She just had her initial screening colonoscopy on 2/22. She was found to have a 22 mm polyp in the ascending colon that was removed, pathology is currently pending. She has been scheduled for a 1 year colonoscopy follow-up. She has routine eye and dental care. She has completed her COVID and influenza vaccination series. She is due for Tdap and shingles vaccinations. She had a negative mammogram in May 2021 and she also had her annual GYN exam earlier this month.   Past Medical/Surgical History: Past Medical History:  Diagnosis Date  . Anemia   . Anxiety   . Asthma   . Insomnia   . Obesity (BMI 35.0-39.9 without comorbidity)   . Oral herpes     Past Surgical History:  Procedure Laterality Date  . CHOLECYSTECTOMY    . lapband    . TONSILLECTOMY    . tummy tuck      Social History:  reports that she has never smoked. She has never used smokeless tobacco. She reports current alcohol use. She reports that she does not use drugs.  Allergies: Allergies  Allergen Reactions  . Peanut-Containing Drug Products Hives  . Sulfa Antibiotics Nausea And Vomiting    Family History:  Family History  Problem Relation Age of Onset  . Pancreatic cancer Father   . Colon cancer Neg Hx   . Colon polyps Neg Hx   . Esophageal  cancer Neg Hx   . Rectal cancer Neg Hx   . Stomach cancer Neg Hx      Current Outpatient Medications:  .  ALPRAZolam (XANAX) 0.5 MG tablet, Take 1 tablet (0.5 mg total) by mouth daily as needed., Disp: 90 tablet, Rfl: 0 .  Ascorbic Acid (VITAMIN C PO), Take by mouth., Disp: , Rfl:  .  cyclobenzaprine (FLEXERIL) 10 MG tablet, Take 1 tablet (10 mg total) by mouth 2 (two) times daily as needed for muscle spasms., Disp: 20 tablet, Rfl: 0 .  loratadine (CLARITIN) 10 MG tablet, Take 10 mg by mouth daily., Disp: , Rfl:  .  Multiple Vitamin (MULTIVITAMIN) capsule, Take 1 capsule by mouth daily., Disp: , Rfl:  .  Turmeric (QC TUMERIC COMPLEX PO), Take by mouth., Disp: , Rfl:  .  valACYclovir (VALTREX) 1000 MG tablet, TAKE 1 TABLET (1,000 MG TOTAL) BY MOUTH DAILY AS NEEDED., Disp: 90 tablet, Rfl: 1 .  VITAMIN D PO, Take by mouth., Disp: , Rfl:   Review of Systems:  Constitutional: Denies fever, chills, diaphoresis, appetite change and fatigue.  HEENT: Denies photophobia, eye pain, redness, hearing loss, ear pain, congestion, sore throat, rhinorrhea, sneezing, mouth sores, trouble swallowing, neck pain, neck stiffness and tinnitus.   Respiratory: Denies SOB, DOE, cough, chest tightness,  and wheezing.   Cardiovascular: Denies chest pain, palpitations and leg swelling.  Gastrointestinal: Denies nausea, vomiting, abdominal pain, diarrhea, constipation, blood in stool and abdominal distention.  Genitourinary: Denies dysuria, urgency, frequency, hematuria, flank pain and difficulty urinating.  Endocrine: Denies: hot or cold intolerance, sweats, changes in hair or nails, polyuria, polydipsia. Musculoskeletal: Denies myalgias, back pain, joint swelling, arthralgias and gait problem.  Skin: Denies pallor, rash and wound.  Neurological: Denies dizziness, seizures, syncope, weakness, light-headedness, numbness and headaches.  Hematological: Denies adenopathy. Easy bruising, personal or family bleeding history   Psychiatric/Behavioral: Denies suicidal ideation, mood changes, confusion, nervousness, sleep disturbance and agitation    Physical Exam: Vitals:   03/03/20 0736  BP: 124/80  Pulse: 82  Temp: 98.5 F (36.9 C)  TempSrc: Oral  SpO2: 98%  Weight: 243 lb 11.2 oz (110.5 kg)  Height: 5\' 7"  (1.702 m)    Body mass index is 38.17 kg/m.  Constitutional: NAD, calm, comfortable Eyes: PERRL, lids and conjunctivae normal ENMT: Mucous membranes are moist. Posterior pharynx clear of any exudate or lesions. Normal dentition. Tympanic membrane is pearly white, no erythema or bulging. Neck: normal, supple, no masses, no thyromegaly Respiratory: clear to auscultation bilaterally, no wheezing, no crackles. Normal respiratory effort. No accessory muscle use.  Cardiovascular: Regular rate and rhythm, no murmurs / rubs / gallops. No extremity edema. 2+ pedal pulses. Abdomen: no tenderness, no masses palpated. No hepatosplenomegaly. Bowel sounds positive.  Musculoskeletal: no clubbing / cyanosis. No joint deformity upper and lower extremities. Good ROM, no contractures. Normal muscle tone.  Skin: no rashes, lesions, ulcers. No induration Neurologic: CN 2-12 grossly intact. Sensation intact, DTR normal. Strength 5/5 in all 4.  Psychiatric: Normal judgment and insight. Alert and oriented x 3. Normal mood.    Impression and Plan:  Encounter for preventive health examination -She has routine eye and dental care. -She is due for Tdap and shingles vaccinations, otherwise immunizations are up-to-date. -Healthy lifestyle discussed in detail. -She had labs earlier this month, these have been reviewed with her. -She had cervical cancer screening earlier this month with her GYN. -Scheduled for mammogram in April of this year. -She had her initial screening colonoscopy 3 days ago.  Obesity (BMI 35.0-39.9 without comorbidity) -Discussed healthy lifestyle, including increased physical activity and better  food choices to promote weight loss.  Hyperlipidemia, unspecified hyperlipidemia type -This is a new diagnosis with an LDL of 159 earlier this month. -She is working on diet and exercise.  Insomnia, unspecified type -Takes Xanax as needed.  Oral herpes -Takes Valtrex as needed.  Mild intermittent asthma without complication -Well-controlled, not on any controller meds.  Polyp of ascending colon, unspecified type -Awaiting pathology, GI has requested 1 year colonoscopy follow-up.  Skin cancer screening  - Plan: Ambulatory referral to Dermatology    Kiernan Farkas Isaac Bliss, MD [facility]

## 2020-03-08 ENCOUNTER — Encounter: Payer: Self-pay | Admitting: Internal Medicine

## 2020-03-29 ENCOUNTER — Telehealth (INDEPENDENT_AMBULATORY_CARE_PROVIDER_SITE_OTHER): Payer: BC Managed Care – PPO | Admitting: Internal Medicine

## 2020-03-29 VITALS — Wt 220.0 lb

## 2020-03-29 DIAGNOSIS — J069 Acute upper respiratory infection, unspecified: Secondary | ICD-10-CM

## 2020-03-29 NOTE — Progress Notes (Signed)
Virtual Visit via Video Note  I connected with Quamesha Mullet on 03/29/20 at  1:30 PM EDT by a video enabled telemedicine application and verified that I am speaking with the correct person using two identifiers.  Location patient: home Location provider: work office Persons participating in the virtual visit: patient, provider  I discussed the limitations of evaluation and management by telemedicine and the availability of in person appointments. The patient expressed understanding and agreed to proceed.   HPI: She has scheduled this visit to discuss some upper respiratory symptoms that she has been experiencing.  She was out of town in [place] and flew back last week.  While there she started noticing some sinus congestion, runny nose, mild headache, ear popping.  She has been using Mucinex, saline nasal spray and Advil without significant relief.  She has not had any fevers.  She has done 2 home Covid test that have resulted negative.   ROS: Constitutional: Denies fever, chills, diaphoresis, appetite change and fatigue.  HEENT: Denies photophobia, eye pain, redness, hearing loss, mouth sores, trouble swallowing, neck pain, neck stiffness and tinnitus.   Respiratory: Denies SOB, DOE, cough, chest tightness,  and wheezing.   Cardiovascular: Denies chest pain, palpitations and leg swelling.  Gastrointestinal: Denies nausea, vomiting, abdominal pain, diarrhea, constipation, blood in stool and abdominal distention.  Genitourinary: Denies dysuria, urgency, frequency, hematuria, flank pain and difficulty urinating.  Endocrine: Denies: hot or cold intolerance, sweats, changes in hair or nails, polyuria, polydipsia. Musculoskeletal: Denies myalgias, back pain, joint swelling, arthralgias and gait problem.  Skin: Denies pallor, rash and wound.  Neurological: Denies dizziness, seizures, syncope, weakness, light-headedness, numbness and headaches.  Hematological: Denies adenopathy. Easy  bruising, personal or family bleeding history  Psychiatric/Behavioral: Denies suicidal ideation, mood changes, confusion, nervousness, sleep disturbance and agitation   Past Medical History:  Diagnosis Date  . Anemia   . Anxiety   . Asthma   . Insomnia   . Obesity (BMI 35.0-39.9 without comorbidity)   . Oral herpes     Past Surgical History:  Procedure Laterality Date  . CHOLECYSTECTOMY    . lapband    . TONSILLECTOMY    . tummy tuck      Family History  Problem Relation Age of Onset  . Pancreatic cancer Father   . Colon cancer Neg Hx   . Colon polyps Neg Hx   . Esophageal cancer Neg Hx   . Rectal cancer Neg Hx   . Stomach cancer Neg Hx     SOCIAL HX:   reports that she has never smoked. She has never used smokeless tobacco. She reports current alcohol use. She reports that she does not use drugs.   Current Outpatient Medications:  .  ALPRAZolam (XANAX) 0.5 MG tablet, Take 1 tablet (0.5 mg total) by mouth daily as needed., Disp: 90 tablet, Rfl: 0 .  Ascorbic Acid (VITAMIN C PO), Take by mouth., Disp: , Rfl:  .  cyclobenzaprine (FLEXERIL) 10 MG tablet, Take 1 tablet (10 mg total) by mouth 2 (two) times daily as needed for muscle spasms., Disp: 20 tablet, Rfl: 0 .  loratadine (CLARITIN) 10 MG tablet, Take 10 mg by mouth daily., Disp: , Rfl:  .  Multiple Vitamin (MULTIVITAMIN) capsule, Take 1 capsule by mouth daily., Disp: , Rfl:  .  Turmeric (QC TUMERIC COMPLEX PO), Take by mouth., Disp: , Rfl:  .  valACYclovir (VALTREX) 1000 MG tablet, TAKE 1 TABLET (1,000 MG TOTAL) BY MOUTH DAILY AS NEEDED.,  Disp: 90 tablet, Rfl: 1 .  VITAMIN D PO, Take by mouth., Disp: , Rfl:   EXAM:   VITALS per patient if applicable: None reported  GENERAL: alert, oriented, appears well and in no acute distress, sounds "stuffy"  HEENT: atraumatic, conjunttiva clear, no obvious abnormalities on inspection of external nose and ears  NECK: normal movements of the head and neck  LUNGS: on  inspection no signs of respiratory distress, breathing rate appears normal, no obvious gross increased work of breathing, gasping or wheezing  CV: no obvious cyanosis  MS: moves all visible extremities without noticeable abnormality  PSYCH/NEURO: pleasant and cooperative, no obvious depression or anxiety, speech and thought processing grossly intact  ASSESSMENT AND PLAN:   Viral URI  -Given presenting symptoms, PNA, pharyngitis, ear infection are not likely, hence abx have not been prescribed. -Have advised rest, fluids, OTC antihistamines, cough suppressants and mucinex. -RTC if no improvement in 10-14 days.    I discussed the assessment and treatment plan with the patient. The patient was provided an opportunity to ask questions and all were answered. The patient agreed with the plan and demonstrated an understanding of the instructions.   The patient was advised to call back or seek an in-person evaluation if the symptoms worsen or if the condition fails to improve as anticipated.    Lelon Frohlich, MD  [facility] at [facility]

## 2020-04-02 ENCOUNTER — Encounter: Payer: Self-pay | Admitting: Internal Medicine

## 2020-04-07 ENCOUNTER — Other Ambulatory Visit: Payer: Self-pay | Admitting: Internal Medicine

## 2020-04-07 DIAGNOSIS — J209 Acute bronchitis, unspecified: Secondary | ICD-10-CM

## 2020-04-07 MED ORDER — AZITHROMYCIN 250 MG PO TABS: ORAL_TABLET | ORAL | 0 refills | Status: DC

## 2020-04-20 ENCOUNTER — Other Ambulatory Visit: Payer: Self-pay

## 2020-04-20 ENCOUNTER — Ambulatory Visit
Admission: RE | Admit: 2020-04-20 | Discharge: 2020-04-20 | Disposition: A | Discharge status: Home / Self Care | Payer: BC Managed Care – PPO | Source: Ambulatory Visit | Attending: Obstetrics and Gynecology | Admitting: Obstetrics and Gynecology

## 2020-04-20 DIAGNOSIS — Z1231 Encounter for screening mammogram for malignant neoplasm of breast: Secondary | ICD-10-CM | POA: Diagnosis present

## 2020-05-10 ENCOUNTER — Other Ambulatory Visit (HOSPITAL_COMMUNITY): Payer: Self-pay

## 2020-05-10 ENCOUNTER — Telehealth (INDEPENDENT_AMBULATORY_CARE_PROVIDER_SITE_OTHER): Payer: BC Managed Care – PPO | Admitting: Family Medicine

## 2020-05-10 ENCOUNTER — Encounter: Payer: Self-pay | Admitting: Family Medicine

## 2020-05-10 VITALS — Wt 220.0 lb

## 2020-05-10 DIAGNOSIS — U071 COVID-19: Secondary | ICD-10-CM

## 2020-05-10 MED ORDER — MOLNUPIRAVIR EUA 200MG CAPSULE
4.0000 | ORAL_CAPSULE | Freq: Two times a day (BID) | ORAL | 0 refills | Status: AC
  Filled 2020-05-10: qty 40, 5d supply, fill #0

## 2020-05-10 MED ORDER — ALBUTEROL SULFATE HFA 108 (90 BASE) MCG/ACT IN AERS
2.0000 | INHALATION_SPRAY | RESPIRATORY_TRACT | 0 refills | Status: DC | PRN
Start: 2020-05-10 — End: 2020-11-11
  Filled 2020-05-10: qty 8.5, 16d supply, fill #0

## 2020-05-10 NOTE — Progress Notes (Signed)
Subjective:    Patient ID: Brittney [name], female    DOB: 13-Oct-1970, 50 y.o.   MRN: [id number]  HPI Virtual Visit via Video Note  I connected with the patient on 05/10/20 at  2:00 PM EDT by a video enabled telemedicine application and verified that I am speaking with the correct person using two identifiers.  Location patient: home Location provider:work or home office Persons participating in the virtual visit: patient, provider  I discussed the limitations of evaluation and management by telemedicine and the availability of in person appointments. The patient expressed understanding and agreed to proceed.   HPI: Here for a Covid-19 infection. For the past 3 days she has had a dry cough, mild SOB, scratchy throat, headache, and nausea. No fever or vomiting or diarrhea. She is drinking fluids. She tested positive for the Covid virus at home this morning.    ROS: See pertinent positives and negatives per HPI.  Past Medical History:  Diagnosis Date  . Anemia   . Anxiety   . Asthma   . Insomnia   . Obesity (BMI 35.0-39.9 without comorbidity)   . Oral herpes     Past Surgical History:  Procedure Laterality Date  . CHOLECYSTECTOMY    . lapband    . TONSILLECTOMY    . tummy tuck      Family History  Problem Relation Age of Onset  . Pancreatic cancer Father   . Breast cancer Maternal Grandmother   . Colon cancer Neg Hx   . Colon polyps Neg Hx   . Esophageal cancer Neg Hx   . Rectal cancer Neg Hx   . Stomach cancer Neg Hx      Current Outpatient Medications:  .  ALPRAZolam (XANAX) 0.5 MG tablet, Take 1 tablet (0.5 mg total) by mouth daily as needed., Disp: 90 tablet, Rfl: 0 .  Ascorbic Acid (VITAMIN C PO), Take by mouth., Disp: , Rfl:  .  azithromycin (ZITHROMAX) 250 MG tablet, Take as directed, Disp: 6 tablet, Rfl: 0 .  cyclobenzaprine (FLEXERIL) 10 MG tablet, Take 1 tablet (10 mg total) by mouth 2 (two) times daily as needed for muscle spasms., Disp: 20  tablet, Rfl: 0 .  loratadine (CLARITIN) 10 MG tablet, Take 10 mg by mouth daily., Disp: , Rfl:  .  Multiple Vitamin (MULTIVITAMIN) capsule, Take 1 capsule by mouth daily., Disp: , Rfl:  .  Turmeric (QC TUMERIC COMPLEX PO), Take by mouth., Disp: , Rfl:  .  valACYclovir (VALTREX) 1000 MG tablet, TAKE 1 TABLET (1,000 MG TOTAL) BY MOUTH DAILY AS NEEDED., Disp: 90 tablet, Rfl: 1 .  VITAMIN D PO, Take by mouth., Disp: , Rfl:   EXAM:  VITALS per patient if applicable:  GENERAL: alert, oriented, appears well and in no acute distress  HEENT: atraumatic, conjunttiva clear, no obvious abnormalities on inspection of external nose and ears  NECK: normal movements of the head and neck  LUNGS: on inspection no signs of respiratory distress, breathing rate appears normal, no obvious gross SOB, gasping or wheezing  CV: no obvious cyanosis  MS: moves all visible extremities without noticeable abnormality  PSYCH/NEURO: pleasant and cooperative, no obvious depression or anxiety, speech and thought processing grossly intact  ASSESSMENT AND PLAN: Covid-19 infection . We will treat her with 5 days of Molnupiravir. We will supply an albuterol inhaler to use as needed, since she has ahx of asthma. She will quarantine at home for 5 days.  Alysia Penna, MD  Discussed the  following assessment and plan:  No diagnosis found.     I discussed the assessment and treatment plan with the patient. The patient was provided an opportunity to ask questions and all were answered. The patient agreed with the plan and demonstrated an understanding of the instructions.   The patient was advised to call back or seek an in-person evaluation if the symptoms worsen or if the condition fails to improve as anticipated.     Review of Systems     Objective:   Physical Exam        Assessment & Plan:

## 2020-06-19 ENCOUNTER — Ambulatory Visit
Admission: EM | Admit: 2020-06-19 | Discharge: 2020-06-19 | Disposition: A | Discharge status: Home / Self Care | Payer: BC Managed Care – PPO | Attending: Student | Admitting: Student

## 2020-06-19 ENCOUNTER — Encounter: Payer: Self-pay | Admitting: *Deleted

## 2020-06-19 ENCOUNTER — Other Ambulatory Visit: Payer: Self-pay

## 2020-06-19 DIAGNOSIS — L03113 Cellulitis of right upper limb: Secondary | ICD-10-CM

## 2020-06-19 DIAGNOSIS — W57XXXA Bitten or stung by nonvenomous insect and other nonvenomous arthropods, initial encounter: Secondary | ICD-10-CM

## 2020-06-19 DIAGNOSIS — Z23 Encounter for immunization: Secondary | ICD-10-CM

## 2020-06-19 MED ORDER — DOXYCYCLINE HYCLATE 100 MG PO CAPS: 100.0000 mg | ORAL_CAPSULE | Freq: Two times a day (BID) | ORAL | 0 refills | Status: AC

## 2020-06-19 NOTE — Discharge Instructions (Addendum)
-  Doxycycline twice daily for 7 days.  Try to take this about 1 hour before eating.  Wear sunscreen while spending long periods of time outside while taking this medication. -Wash with gentle soap and water 1-2 times daily.  You can also use a warm compress 1-2 times daily to help any drainage come out.  You can follow this with an over-the-counter antibiotic ointment and then a bandage to keep it clean. -Seek additional medical attention if you develop new symptoms like swelling of the arm, redness surrounding the insect bite, new fever/chills, discharge from the area that is getting worse instead of better. -Please check with your primary care provider to make sure that your tetanus is up-to-date within the last 5 years.  If it is not, schedule a nurse visit with your primary care, or an appointment with [facility] or similar for a tetanus shot.

## 2020-06-19 NOTE — ED Provider Notes (Addendum)
[place]    CSN: [id number] Arrival date & time: 06/19/20  1931      History   Chief Complaint Chief Complaint  Patient presents with   Insect Bite    HPI Brittney [name] is a 50 y.o. female presenting for possible insect bite.  Medical history anxiety, asthma, insomnia, obesity, anemia. States that she was gardening 1 day ago, did not notice anything until this morning.  States this morning she noticed a small area of redness and swelling on her right deltoid, since then this is gotten a little more painful with redness and some clear discharge.  Denies fever/chills, wounds or abscesses elsewhere.  HPI  Past Medical History:  Diagnosis Date   Anemia    Anxiety    Asthma    Insomnia    Obesity (BMI 35.0-39.9 without comorbidity)    Oral herpes     Patient Active Problem List   Diagnosis Date Noted   COVID-19 virus infection 05/10/2020   Hyperlipidemia 02/24/2020   Obesity (BMI 35.0-39.9 without comorbidity)    Insomnia    Oral herpes    Anemia    Asthma     Past Surgical History:  Procedure Laterality Date   CHOLECYSTECTOMY     lapband     TONSILLECTOMY     tummy tuck      OB History   No obstetric history on file.      Home Medications    Prior to Admission medications   Medication Sig Start Date End Date Taking? Authorizing Provider  ALPRAZolam Duanne Moron) 0.5 MG tablet Take 1 tablet (0.5 mg total) by mouth daily as needed. 02/24/20  Yes Isaac Bliss, Rayford Halsted, MD  Ascorbic Acid (VITAMIN C PO) Take by mouth.   Yes [provider]  doxycycline (VIBRAMYCIN) 100 MG capsule Take 1 capsule (100 mg total) by mouth 2 (two) times daily for 7 days. 06/19/20 06/26/20 Yes Hazel Sams, PA-C  loratadine (CLARITIN) 10 MG tablet Take 10 mg by mouth daily.   Yes [provider]  Multiple Vitamin (MULTIVITAMIN) capsule Take 1 capsule by mouth daily.   Yes [provider]  Turmeric (QC TUMERIC COMPLEX PO) Take by mouth.    Yes [provider]  VITAMIN D PO Take by mouth.   Yes [provider]  albuterol (VENTOLIN HFA) 108 (90 Base) MCG/ACT inhaler Inhale 2 puffs into the lungs every 4 (four) hours as needed for wheezing or shortness of breath. 05/10/20   Laurey Morale, MD  cyclobenzaprine (FLEXERIL) 10 MG tablet Take 1 tablet (10 mg total) by mouth 2 (two) times daily as needed for muscle spasms. 09/21/19   Wardell Honour, MD  valACYclovir (VALTREX) 1000 MG tablet TAKE 1 TABLET (1,000 MG TOTAL) BY MOUTH DAILY AS NEEDED. 12/14/19   Isaac Bliss, Rayford Halsted, MD    Family History Family History  Problem Relation Age of Onset   Healthy Mother    Cancer Father    Pancreatic cancer Father    Breast cancer Maternal Grandmother    Colon cancer Neg Hx    Colon polyps Neg Hx    Esophageal cancer Neg Hx    Rectal cancer Neg Hx    Stomach cancer Neg Hx     Social History Social History   Tobacco Use   Smoking status: Never   Smokeless tobacco: Never  Vaping Use   Vaping Use: Never used  Substance Use Topics   Alcohol use: Not Currently  Drug use: Never     Allergies   Peanut-containing drug products and Sulfa antibiotics   Review of Systems Review of Systems  Skin:  Positive for wound.    Physical Exam Triage Vital Signs ED Triage Vitals  Enc Vitals Group     BP      Pulse      Resp      Temp      Temp src      SpO2      Weight      Height      Head Circumference      Peak Flow      Pain Score      Pain Loc      Pain Edu?      Excl. in [place]?    No data found.  Updated Vital Signs BP 109/77 (BP Location: Left Arm)   Pulse 73   Temp 97.8 F (36.6 C) (Oral)   Resp 18   LMP 06/11/2020 (Exact Date)   SpO2 99%   Visual Acuity Right Eye Distance:   Left Eye Distance:   Bilateral Distance:    Right Eye Near:   Left Eye Near:    Bilateral Near:     Physical Exam Vitals reviewed.  Constitutional:      General: She is not in acute distress.     Appearance: Normal appearance. She is not ill-appearing or diaphoretic.  HENT:     Head: Normocephalic and atraumatic.  Cardiovascular:     Rate and Rhythm: Normal rate and regular rhythm.     Heart sounds: Normal heart sounds.  Pulmonary:     Effort: Pulmonary effort is normal.     Breath sounds: Normal breath sounds.  Skin:    General: Skin is warm.     Comments: R deltoid with 1cm area of erythema and warmth with central scab. Scant serous drainage. No induration or fluctuance.   Neurological:     General: No focal deficit present.     Mental Status: She is alert and oriented to person, place, and time.  Psychiatric:        Mood and Affect: Mood normal.        Behavior: Behavior normal.        Thought Content: Thought content normal.        Judgment: Judgment normal.     UC Treatments / Results  Labs (all labs ordered are listed, but only abnormal results are displayed) Labs Reviewed - No data to display  EKG   Radiology No results found.  Procedures Procedures (including critical care time)  Medications Ordered in UC Medications - No data to display  Initial Impression / Assessment and Plan / UC Course  I have reviewed the triage vital signs and the nursing notes.  Pertinent labs & imaging results that were available during my care of the patient were reviewed by me and considered in my medical decision making (see chart for details).     This patient is a 50 year old female presenting with cellulitis from insect bite.  Afebrile, nontachycardic.  Doxycycline sent as below.  States she states she is not pregnant.  Tetanus is not up-to-date, we are out of stock of the so recommended she follow-up with her primary care for this.  Wound care instructions discussed and ED return precautions discussed.  Final Clinical Impressions(s) / UC Diagnoses   Final diagnoses:  Bug bite with infection, initial encounter  Need for Tdap vaccination  Cellulitis  of right upper  extremity     Discharge Instructions      -Doxycycline twice daily for 7 days.  Try to take this about 1 hour before eating.  Wear sunscreen while spending long periods of time outside while taking this medication. -Wash with gentle soap and water 1-2 times daily.  You can also use a warm compress 1-2 times daily to help any drainage come out.  You can follow this with an over-the-counter antibiotic ointment and then a bandage to keep it clean. -Seek additional medical attention if you develop new symptoms like swelling of the arm, redness surrounding the insect bite, new fever/chills, discharge from the area that is getting worse instead of better. -Please check with your primary care provider to make sure that your tetanus is up-to-date within the last 5 years.  If it is not, schedule a nurse visit with your primary care, or an appointment with [facility] or similar for a tetanus shot.     ED Prescriptions     Medication Sig Dispense Auth. Provider   doxycycline (VIBRAMYCIN) 100 MG capsule Take 1 capsule (100 mg total) by mouth 2 (two) times daily for 7 days. 14 capsule Hazel Sams, PA-C      PDMP not reviewed this encounter.   Hazel Sams, PA-C 06/19/20 2044    Hazel Sams, PA-C 06/24/20 1000

## 2020-06-19 NOTE — ED Triage Notes (Signed)
Pt reports feeling sensation like she sustained an insect bite to right upper arm while gardening yesterday afternoon.  C/O tender lesion that is now "oozing" today.

## 2020-07-15 ENCOUNTER — Telehealth: Payer: BC Managed Care – PPO

## 2020-08-21 ENCOUNTER — Ambulatory Visit (INDEPENDENT_AMBULATORY_CARE_PROVIDER_SITE_OTHER): Payer: BC Managed Care – PPO | Admitting: Dermatology

## 2020-08-21 ENCOUNTER — Other Ambulatory Visit: Payer: Self-pay

## 2020-08-21 ENCOUNTER — Encounter: Payer: Self-pay | Admitting: Dermatology

## 2020-08-21 DIAGNOSIS — D489 Neoplasm of uncertain behavior, unspecified: Secondary | ICD-10-CM

## 2020-08-21 DIAGNOSIS — D485 Neoplasm of uncertain behavior of skin: Secondary | ICD-10-CM

## 2020-08-21 DIAGNOSIS — D225 Melanocytic nevi of trunk: Secondary | ICD-10-CM

## 2020-08-21 DIAGNOSIS — L219 Seborrheic dermatitis, unspecified: Secondary | ICD-10-CM | POA: Diagnosis not present

## 2020-08-21 DIAGNOSIS — Z1283 Encounter for screening for malignant neoplasm of skin: Secondary | ICD-10-CM

## 2020-08-21 DIAGNOSIS — L918 Other hypertrophic disorders of the skin: Secondary | ICD-10-CM

## 2020-08-21 DIAGNOSIS — D1801 Hemangioma of skin and subcutaneous tissue: Secondary | ICD-10-CM

## 2020-08-21 NOTE — Patient Instructions (Addendum)
Hydrocortisone ointment Biopsy, Surgery (Curettage) & Surgery (Excision) Aftercare Instructions  1. Okay to remove bandage in 24 hours  2. Wash area with soap and water  3. Apply Vaseline to area twice daily until healed (Not Neosporin)  4. Okay to cover with a Band-Aid to decrease the chance of infection or prevent irritation from clothing; also it's okay to uncover lesion at home.  5. Suture instructions: return to our office in 7-10 or 10-14 days for a nurse visit for suture removal. Variable healing with sutures, if pain or itching occurs call our office. It's okay to shower or bathe 24 hours after sutures are given.  6. The following risks may occur after a biopsy, curettage or excision: bleeding, scarring, discoloration, recurrence, infection (redness, yellow drainage, pain or swelling).  7. For questions, concerns and results call our office at [place] before 4pm & Friday before 3pm. Biopsy results will be available in 1 week.

## 2020-08-29 NOTE — Addendum Note (Signed)
Addended by: Lavonna Monarch on: 08/29/2020 06:44 AM   Modules accepted: Level of Service

## 2020-08-29 NOTE — Progress Notes (Signed)
   New Patient   Subjective  Brittney [name] is a 50 y.o. female who presents for the following: Annual Exam (Left front hair line x years , also under arm unsure which one skin tag).  General skin examination, growth on the right arm has grown. Location:  Duration:  Quality:  Associated Signs/Symptoms: Modifying Factors:  Severity:  Timing: Context:    The following portions of the chart were reviewed this encounter and updated as appropriate:  Tobacco  Allergies  Meds  Problems  Med Hx  Surg Hx  Fam Hx      Objective  Well appearing patient in no apparent distress; mood and affect are within normal limits. Mid Back Fullbody skin check   Mid Forehead Forehead, under breast, right hip and abdomen: Smooth red 1 to 3 mm dermal papules  Left Ala Nasi, Right Ala Nasi No rash present today but history of central facial pinkness dryness could certainly fit seborrheic dermatitis  Right Axilla No photo  Right Axilla Pigmented 4 mm papule right axilla with history of recurrent irritation    A full examination was performed including scalp, head, eyes, ears, nose, lips, neck, chest, axillae, abdomen, back, buttocks, bilateral upper extremities, bilateral lower extremities, hands, feet, fingers, toes, fingernails, and toenails. All findings within normal limits unless otherwise noted below.  Areas beneath undergarments not fully examined.   Assessment & Plan  Screening exam for skin cancer Mid Back  No atypical moles or skin cancer found today keep yearly skin exam.  Encouraged to self examine twice annually.  Continue ultraviolet protection.  Cherry angioma Mid Forehead  No intervention necessary  Seborrheic dermatitis Left Ala Nasi; Right Ala Nasi  Hydrocortisone ointment over the counter if there is a flare; apply presleep for 1 to 2 weeks and taper if responds.  Neoplasm of uncertain behavior of skin Right Axilla  Skin / nail biopsy Type of biopsy:  tangential   Informed consent: discussed and consent obtained   Timeout: patient name, date of birth, surgical site, and procedure verified   Procedure prep:  Patient was prepped and draped in usual sterile fashion (Non sterile) Prep type:  Chlorhexidine Anesthesia: the lesion was anesthetized in a standard fashion   Anesthetic:  1% lidocaine w/ epinephrine 1-100,000 local infiltration Instrument used: flexible razor blade   Outcome: patient tolerated procedure well   Post-procedure details: wound care instructions given    Specimen 1 - Surgical pathology Differential Diagnosis: skin tag   Check Margins: No  Neoplasm of uncertain behavior Right Axilla  Per patient request we will do scissor biopsy with light cautery of base.  Cautioned about stinging with antiperspirants for several days.

## 2020-10-16 ENCOUNTER — Telehealth: Payer: BC Managed Care – PPO | Admitting: Physician Assistant

## 2020-10-16 DIAGNOSIS — R3989 Other symptoms and signs involving the genitourinary system: Secondary | ICD-10-CM

## 2020-10-16 MED ORDER — PHENAZOPYRIDINE HCL 100 MG PO TABS: 100.0000 mg | ORAL_TABLET | Freq: Three times a day (TID) | ORAL | 0 refills | Status: DC | PRN

## 2020-10-16 MED ORDER — CEPHALEXIN 500 MG PO CAPS: 500.0000 mg | ORAL_CAPSULE | Freq: Two times a day (BID) | ORAL | 0 refills | Status: DC

## 2020-10-16 NOTE — Addendum Note (Signed)
Addended by: [name] on: 10/16/2020 03:33 PM   Modules accepted: Orders

## 2020-10-16 NOTE — Progress Notes (Signed)
Duplicate encounter

## 2020-10-16 NOTE — Progress Notes (Signed)

## 2020-10-18 ENCOUNTER — Encounter: Payer: Self-pay | Admitting: Family Medicine

## 2020-10-18 ENCOUNTER — Other Ambulatory Visit: Payer: Self-pay

## 2020-10-18 ENCOUNTER — Telehealth: Payer: Self-pay

## 2020-10-18 ENCOUNTER — Other Ambulatory Visit: Payer: Self-pay | Admitting: Family Medicine

## 2020-10-18 ENCOUNTER — Ambulatory Visit (INDEPENDENT_AMBULATORY_CARE_PROVIDER_SITE_OTHER): Payer: BC Managed Care – PPO | Admitting: Family Medicine

## 2020-10-18 VITALS — BP 118/82 | HR 72 | Temp 98.0°F | Wt 242.0 lb

## 2020-10-18 DIAGNOSIS — R1030 Lower abdominal pain, unspecified: Secondary | ICD-10-CM

## 2020-10-18 DIAGNOSIS — Z9884 Bariatric surgery status: Secondary | ICD-10-CM

## 2020-10-18 DIAGNOSIS — R3989 Other symptoms and signs involving the genitourinary system: Secondary | ICD-10-CM

## 2020-10-18 DIAGNOSIS — R300 Dysuria: Secondary | ICD-10-CM

## 2020-10-18 DIAGNOSIS — K37 Unspecified appendicitis: Secondary | ICD-10-CM

## 2020-10-18 DIAGNOSIS — E785 Hyperlipidemia, unspecified: Secondary | ICD-10-CM

## 2020-10-18 LAB — CBC WITH DIFFERENTIAL/PLATELET
Basophils Absolute: 0 10*3/uL (ref 0.0–0.1)
Basophils Relative: 0.5 % (ref 0.0–3.0)
Eosinophils Absolute: 0.2 10*3/uL (ref 0.0–0.7)
Eosinophils Relative: 3.7 % (ref 0.0–5.0)
HCT: 39.6 % (ref 36.0–46.0)
Hemoglobin: 12.9 g/dL (ref 12.0–15.0)
Lymphocytes Relative: 26.4 % (ref 12.0–46.0)
Lymphs Abs: 1.7 10*3/uL (ref 0.7–4.0)
MCHC: 32.6 g/dL (ref 30.0–36.0)
MCV: 89.7 fl (ref 78.0–100.0)
Monocytes Absolute: 0.8 10*3/uL (ref 0.1–1.0)
Monocytes Relative: 12 % (ref 3.0–12.0)
Neutro Abs: 3.7 10*3/uL (ref 1.4–7.7)
Neutrophils Relative %: 57.4 % (ref 43.0–77.0)
Platelets: 275 10*3/uL (ref 150.0–400.0)
RBC: 4.41 Mil/uL (ref 3.87–5.11)
RDW: 13.8 % (ref 11.5–15.5)
WBC: 6.5 10*3/uL (ref 4.0–10.5)

## 2020-10-18 LAB — COMPREHENSIVE METABOLIC PANEL
ALT: 12 U/L (ref 0–35)
AST: 16 U/L (ref 0–37)
Albumin: 4.3 g/dL (ref 3.5–5.2)
Alkaline Phosphatase: 71 U/L (ref 39–117)
BUN: 8 mg/dL (ref 6–23)
CO2: 30 mEq/L (ref 19–32)
Calcium: 9.3 mg/dL (ref 8.4–10.5)
Chloride: 99 mEq/L (ref 96–112)
Creatinine, Ser: 0.73 mg/dL (ref 0.40–1.20)
GFR: 95.48 mL/min (ref >60.00)
Glucose, Bld: 81 mg/dL (ref 70–99)
Potassium: 3.8 mEq/L (ref 3.5–5.1)
Sodium: 137 mEq/L (ref 135–145)
Total Bilirubin: 0.7 mg/dL (ref 0.2–1.2)
Total Protein: 7.9 g/dL (ref 6.0–8.3)

## 2020-10-18 LAB — LIPID PANEL
Cholesterol: 221 mg/dL — ABNORMAL HIGH (ref 0–200)
HDL: 62 mg/dL (ref >39.00)
LDL Cholesterol: 135 mg/dL — ABNORMAL HIGH (ref 0–99)
NonHDL: 158.65
Total CHOL/HDL Ratio: 4
Triglycerides: 120 mg/dL (ref 0.0–149.0)
VLDL: 24 mg/dL (ref 0.0–40.0)

## 2020-10-18 LAB — POCT URINALYSIS DIPSTICK
Bilirubin, UA: NEGATIVE
Blood, UA: NEGATIVE
Glucose, UA: NEGATIVE
Ketones, UA: NEGATIVE
Leukocytes, UA: NEGATIVE
Nitrite, UA: NEGATIVE
Protein, UA: NEGATIVE
Spec Grav, UA: <=1.005 — AB (ref 1.010–1.025)
Urobilinogen, UA: NEGATIVE E.U./dL — AB
pH, UA: 7 (ref 5.0–8.0)

## 2020-10-18 MED ORDER — CIPROFLOXACIN HCL 500 MG PO TABS: 500.0000 mg | ORAL_TABLET | Freq: Two times a day (BID) | ORAL | 0 refills | Status: AC

## 2020-10-18 NOTE — Progress Notes (Signed)
Subjective:    Patient ID: Brittney [name], female    DOB: 1970/07/14, 50 y.o.   MRN: [id number]  Chief Complaint  Patient presents with   Urinary Tract Infection    Started Monday, felt nauseous and had some abdominal pain. Had Evisit on Monday. Still not feeling any better, hving abdominal pain and the burning while using the restroom.     HPI Patient is a 50 year old female with pmh sig for asthma, anxiety, cholecystectomy, obesity s/p lap band who was seen today for follow-up ED visit for UTI.  Patient seen virtually 10/16/2020 for urinary symptoms including fatigue, n, suprapubic pain, and dysuria.  Given Pyridium and Keflex.  After taking 2.5 days worth of medicine, pt has not noticed improvement in symptoms.  Having looser stools, but drinking mostly liquids 2/2 decreased appetite.  Hitting bumps in the road while driving to clinic visit caused pain in lower abdomen.  Pain has not moved.  Patient has f/u q 6 mo to evaluate lap band.  Seen 09/14/2020, barium Lap-Band follow-up without signs of obstruction or extravasation. Past Medical History:  Diagnosis Date   Anemia    Anxiety    Asthma    Insomnia    Obesity (BMI 35.0-39.9 without comorbidity)    Oral herpes     Allergies  Allergen Reactions   Peanut-Containing Drug Products Hives   Sulfa Antibiotics Nausea And Vomiting    ROS General: Denies fever, chills, night sweats, changes in weight, changes in appetite HEENT: Denies headaches, ear pain, changes in vision, rhinorrhea, sore throat CV: Denies CP, palpitations, SOB, orthopnea Pulm: Denies SOB, cough, wheezing GI: Denies vomiting, diarrhea, constipation  + abdominal pain, nausea GU: Denies hematuria, vaginal discharge  +dysuria, frequency Msk: Denies muscle cramps, joint pains Neuro: Denies weakness, numbness, tingling Skin: Denies rashes, bruising Psych: Denies depression, anxiety, hallucinations     Objective:    Blood pressure 118/82, pulse 72, temperature  98 F (36.7 C), temperature source Oral, weight 242 lb (109.8 kg), SpO2 95 %.  Gen. Pleasant, well-nourished, in no distress, normal affect   HEENT: [place]/AT, face symmetric, conjunctiva clear, no scleral icterus, PERRLA, EOMI, nares patent without drainage, pharynx without erythema or exudate. Neck: No JVD, no thyromegaly, no carotid bruits Lungs: no accessory muscle use, CTAB, no wheezes or rales Cardiovascular: RRR, no m/r/g, no peripheral edema Abdomen: BS normoactive, soft, mild distention, TTP in suprapubic area, LLQ and RLQ.  No hepatosplenomegaly. Musculoskeletal: No deformities, no cyanosis or clubbing, normal tone Neuro:  A&Ox3, CN II-XII intact, normal gait Skin:  Warm, no lesions/ rash   Wt Readings from Last 3 Encounters:  10/18/20 242 lb (109.8 kg)  05/10/20 220 lb (99.8 kg)  03/29/20 220 lb (99.8 kg)    Lab Results  Component Value Date   WBC 5.9 02/24/2020   HGB 13.3 02/24/2020   HCT 39.4 02/24/2020   PLT 275.0 02/24/2020   GLUCOSE 89 02/24/2020   CHOL 239 (H) 02/24/2020   TRIG 139.0 02/24/2020   HDL 52.70 02/24/2020   LDLCALC 159 (H) 02/24/2020   NA 137 02/24/2020   K 4.4 02/24/2020   CL 101 02/24/2020   CREATININE 0.75 02/24/2020   BUN 17 02/24/2020   CO2 30 02/24/2020   TSH 1.80 02/24/2020   HGBA1C 5.6 02/24/2020    Assessment/Plan:  Suspected UTI  -s/p Keflex x2 days without relief in symptoms -As seen virtually no UA or UC obtained.  We will obtain this visit -We will DC Keflex and start Cipro  twice daily -Hydration - Plan: POCT urinalysis dipstick, Culture, Urine, ciprofloxacin (CIPRO) 500 MG tablet  Lower abdominal pain -Discussed possible causes including UTI, diverticulitis, appendicitis, renal calculi, pSBO -Will obtain labs -will d/c Keflex and start Cipro for possible UTI -We will obtain imaging based on labs and continued symptoms -Given strict precautions - Plan: POCT urinalysis dipstick, CBC with Differential/Platelet, CMP, Culture,  Urine  Dysuria  - Plan: POCT urinalysis dipstick, Culture, Urine  History of laparoscopic gastric banding -Recent follow-up revealed no signs of obstruction or extravasation on barium study  F/u prn  Grier Mitts, MD

## 2020-10-18 NOTE — Telephone Encounter (Signed)
Patient called to get results and information about Ct scan I informed patient that Dr. Volanda Napoleon is seeing patients and someone would reach out to her when results are ready.

## 2020-10-19 ENCOUNTER — Emergency Department (HOSPITAL_COMMUNITY)
Admission: EM | Admit: 2020-10-19 | Discharge: 2020-10-20 | Disposition: A | Discharge status: Home / Self Care | Payer: BC Managed Care – PPO | Source: Home / Self Care | Attending: Emergency Medicine | Admitting: Emergency Medicine

## 2020-10-19 ENCOUNTER — Other Ambulatory Visit: Payer: Self-pay

## 2020-10-19 ENCOUNTER — Encounter (HOSPITAL_COMMUNITY): Payer: Self-pay | Admitting: Pharmacy Technician

## 2020-10-19 DIAGNOSIS — R1032 Left lower quadrant pain: Secondary | ICD-10-CM | POA: Insufficient documentation

## 2020-10-19 DIAGNOSIS — R195 Other fecal abnormalities: Secondary | ICD-10-CM | POA: Insufficient documentation

## 2020-10-19 DIAGNOSIS — R109 Unspecified abdominal pain: Secondary | ICD-10-CM | POA: Diagnosis present

## 2020-10-19 DIAGNOSIS — J45909 Unspecified asthma, uncomplicated: Secondary | ICD-10-CM | POA: Diagnosis not present

## 2020-10-19 DIAGNOSIS — K353 Acute appendicitis with localized peritonitis, without perforation or gangrene: Secondary | ICD-10-CM | POA: Diagnosis not present

## 2020-10-19 DIAGNOSIS — Z5321 Procedure and treatment not carried out due to patient leaving prior to being seen by health care provider: Secondary | ICD-10-CM | POA: Insufficient documentation

## 2020-10-19 DIAGNOSIS — Z20822 Contact with and (suspected) exposure to covid-19: Secondary | ICD-10-CM | POA: Diagnosis not present

## 2020-10-19 DIAGNOSIS — Z91010 Allergy to peanuts: Secondary | ICD-10-CM | POA: Diagnosis not present

## 2020-10-19 LAB — COMPREHENSIVE METABOLIC PANEL
ALT: 13 U/L (ref 0–44)
AST: 17 U/L (ref 15–41)
Albumin: 3.7 g/dL (ref 3.5–5.0)
Alkaline Phosphatase: 70 U/L (ref 38–126)
Anion gap: 8 (ref 5–15)
BUN: 7 mg/dL (ref 6–20)
CO2: 27 mmol/L (ref 22–32)
Calcium: 9.3 mg/dL (ref 8.9–10.3)
Chloride: 103 mmol/L (ref 98–111)
Creatinine, Ser: 0.75 mg/dL (ref 0.44–1.00)
GFR, Estimated: >60 mL/min (ref >60)
Glucose, Bld: 103 mg/dL — ABNORMAL HIGH (ref 70–99)
Potassium: 4.2 mmol/L (ref 3.5–5.1)
Sodium: 138 mmol/L (ref 135–145)
Total Bilirubin: 0.5 mg/dL (ref 0.3–1.2)
Total Protein: 7.3 g/dL (ref 6.5–8.1)

## 2020-10-19 LAB — CBC
HCT: 42.7 % (ref 36.0–46.0)
Hemoglobin: 13.4 g/dL (ref 12.0–15.0)
MCH: 29 pg (ref 26.0–34.0)
MCHC: 31.4 g/dL (ref 30.0–36.0)
MCV: 92.4 fL (ref 80.0–100.0)
Platelets: 303 10*3/uL (ref 150–400)
RBC: 4.62 MIL/uL (ref 3.87–5.11)
RDW: 13.2 % (ref 11.5–15.5)
WBC: 6.2 10*3/uL (ref 4.0–10.5)
nRBC: 0 % (ref 0.0–0.2)

## 2020-10-19 LAB — URINE CULTURE
MICRO NUMBER:: 12493974
Result:: NO GROWTH
SPECIMEN QUALITY:: ADEQUATE

## 2020-10-19 LAB — I-STAT BETA HCG BLOOD, ED (MC, WL, AP ONLY): I-stat hCG, quantitative: <5 m[IU]/mL (ref <5)

## 2020-10-19 LAB — URINALYSIS, ROUTINE W REFLEX MICROSCOPIC
Bilirubin Urine: NEGATIVE
Glucose, UA: NEGATIVE mg/dL
Hgb urine dipstick: NEGATIVE
Ketones, ur: NEGATIVE mg/dL
Leukocytes,Ua: NEGATIVE
Nitrite: NEGATIVE
Protein, ur: NEGATIVE mg/dL
Specific Gravity, Urine: 1.008 (ref 1.005–1.030)
pH: 5 (ref 5.0–8.0)

## 2020-10-19 LAB — LIPASE, BLOOD: Lipase: 46 U/L (ref 11–51)

## 2020-10-19 NOTE — ED Provider Notes (Signed)
Emergency Medicine Provider Triage Evaluation Note  Brittney [name] , a 51 y.o. female  was evaluated in triage.  Pt complains of abdominal pain.  Began earlier this week.  Initially did telehealth visit with PCP.  Thought UTI, started on Keflex, AZO.  Symptoms did not change, was seen yesterday by PCP, antibiotics changed to Cipro.  She had labs done.  Patient denies any dysuria.  Has had some loose stools without any blood.  Pain located diffusely to lower abdomen however worse to left lower quadrant.  Review of Systems  Positive: Abdominal pain, diarrhea, nausea Negative: \Dysuria, hematuria Physical Exam  BP 115/77 (BP Location: Right Arm)   Pulse 68   Temp 98.4 F (36.9 C)   Resp 15   SpO2 100%  Gen:   Awake, no distress   Resp:  Normal effort  MSK:   Moves extremities without difficulty  ABD:  Diffuse tenderness to abdomen Other:    Medical Decision Making  Medically screening exam initiated at 4:26 PM.  Appropriate orders placed.  Brittney [name] was informed that the remainder of the evaluation will be completed by another provider, this initial triage assessment does not replace that evaluation, and the importance of remaining in the ED until their evaluation is complete.  Abdominal pain, nausea, diarrhea   Cardell Rachel A, PA-C 10/19/20 1628    Brittney Belling, MD 10/19/20 2227

## 2020-10-19 NOTE — Telephone Encounter (Signed)
Pt is calling to see if dr banks is going to order abd ct scan with or without contrast. Pt is anxious to  proceed

## 2020-10-19 NOTE — Telephone Encounter (Signed)
Patient is calling back again to see if Dr. Volanda Napoleon is going to order a CT scan with or without contrast. Patient is wanting to proceed immediately.  She states that she has sent multiple messages back with no response.

## 2020-10-19 NOTE — ED Triage Notes (Signed)
Pt here with LLQ abd pain worsening over the last day. Pt seen at PCP and started on Keflex last week, changed to cipro yesterday without improvement. Denies dysuria.

## 2020-10-20 ENCOUNTER — Ambulatory Visit (INDEPENDENT_AMBULATORY_CARE_PROVIDER_SITE_OTHER): Payer: BC Managed Care – PPO

## 2020-10-20 ENCOUNTER — Encounter (HOSPITAL_COMMUNITY)
Admission: EM | Disposition: A | Discharge status: Home / Self Care | Payer: Self-pay | Source: Home / Self Care | Attending: Emergency Medicine

## 2020-10-20 ENCOUNTER — Emergency Department (HOSPITAL_BASED_OUTPATIENT_CLINIC_OR_DEPARTMENT_OTHER): Payer: BC Managed Care – PPO

## 2020-10-20 ENCOUNTER — Encounter (HOSPITAL_BASED_OUTPATIENT_CLINIC_OR_DEPARTMENT_OTHER): Payer: Self-pay

## 2020-10-20 ENCOUNTER — Other Ambulatory Visit: Payer: Self-pay

## 2020-10-20 ENCOUNTER — Ambulatory Visit
Admission: RE | Admit: 2020-10-20 | Discharge: 2020-10-20 | Disposition: A | Discharge status: Home / Self Care | Payer: BC Managed Care – PPO | Source: Ambulatory Visit | Attending: Physician Assistant | Admitting: Physician Assistant

## 2020-10-20 ENCOUNTER — Emergency Department (HOSPITAL_COMMUNITY): Payer: BC Managed Care – PPO | Admitting: Anesthesiology

## 2020-10-20 ENCOUNTER — Encounter: Payer: Self-pay | Admitting: Family Medicine

## 2020-10-20 ENCOUNTER — Observation Stay (HOSPITAL_BASED_OUTPATIENT_CLINIC_OR_DEPARTMENT_OTHER)
Admission: EM | Admit: 2020-10-20 | Discharge: 2020-10-21 | Disposition: A | Discharge status: Home / Self Care | Payer: BC Managed Care – PPO | Attending: Surgery | Admitting: Surgery

## 2020-10-20 VITALS — BP 113/81 | HR 66 | Temp 97.9°F | Resp 20

## 2020-10-20 DIAGNOSIS — R109 Unspecified abdominal pain: Secondary | ICD-10-CM

## 2020-10-20 DIAGNOSIS — Z20822 Contact with and (suspected) exposure to covid-19: Secondary | ICD-10-CM | POA: Insufficient documentation

## 2020-10-20 DIAGNOSIS — Z91010 Allergy to peanuts: Secondary | ICD-10-CM | POA: Insufficient documentation

## 2020-10-20 DIAGNOSIS — J45909 Unspecified asthma, uncomplicated: Secondary | ICD-10-CM | POA: Insufficient documentation

## 2020-10-20 DIAGNOSIS — K3580 Unspecified acute appendicitis: Secondary | ICD-10-CM | POA: Diagnosis present

## 2020-10-20 DIAGNOSIS — K353 Acute appendicitis with localized peritonitis, without perforation or gangrene: Secondary | ICD-10-CM | POA: Diagnosis not present

## 2020-10-20 DIAGNOSIS — K37 Unspecified appendicitis: Secondary | ICD-10-CM | POA: Diagnosis present

## 2020-10-20 DIAGNOSIS — R1030 Lower abdominal pain, unspecified: Secondary | ICD-10-CM

## 2020-10-20 HISTORY — PX: LAPAROSCOPIC APPENDECTOMY: SHX408

## 2020-10-20 LAB — COMPREHENSIVE METABOLIC PANEL
ALT: 12 U/L (ref 0–44)
AST: 14 U/L — ABNORMAL LOW (ref 15–41)
Albumin: 4.4 g/dL (ref 3.5–5.0)
Alkaline Phosphatase: 72 U/L (ref 38–126)
Anion gap: 10 (ref 5–15)
BUN: 9 mg/dL (ref 6–20)
CO2: 28 mmol/L (ref 22–32)
Calcium: 9.6 mg/dL (ref 8.9–10.3)
Chloride: 100 mmol/L (ref 98–111)
Creatinine, Ser: 0.68 mg/dL (ref 0.44–1.00)
GFR, Estimated: >60 mL/min (ref >60)
Glucose, Bld: 81 mg/dL (ref 70–99)
Potassium: 4 mmol/L (ref 3.5–5.1)
Sodium: 138 mmol/L (ref 135–145)
Total Bilirubin: 0.6 mg/dL (ref 0.3–1.2)
Total Protein: 7.9 g/dL (ref 6.5–8.1)

## 2020-10-20 LAB — CBC
HCT: 42.2 % (ref 36.0–46.0)
Hemoglobin: 13.6 g/dL (ref 12.0–15.0)
MCH: 29.2 pg (ref 26.0–34.0)
MCHC: 32.2 g/dL (ref 30.0–36.0)
MCV: 90.8 fL (ref 80.0–100.0)
Platelets: 320 10*3/uL (ref 150–400)
RBC: 4.65 MIL/uL (ref 3.87–5.11)
RDW: 13.1 % (ref 11.5–15.5)
WBC: 7.1 10*3/uL (ref 4.0–10.5)
nRBC: 0 % (ref 0.0–0.2)

## 2020-10-20 LAB — RESP PANEL BY RT-PCR (FLU A&B, COVID) ARPGX2
Influenza A by PCR: NEGATIVE
Influenza B by PCR: NEGATIVE
SARS Coronavirus 2 by RT PCR: NEGATIVE

## 2020-10-20 LAB — LIPASE, BLOOD: Lipase: 11 U/L (ref 11–51)

## 2020-10-20 LAB — URINALYSIS, ROUTINE W REFLEX MICROSCOPIC
Bilirubin Urine: NEGATIVE
Glucose, UA: NEGATIVE mg/dL
Hgb urine dipstick: NEGATIVE
Ketones, ur: NEGATIVE mg/dL
Leukocytes,Ua: NEGATIVE
Nitrite: NEGATIVE
Specific Gravity, Urine: 1.017 (ref 1.005–1.030)
pH: 6 (ref 5.0–8.0)

## 2020-10-20 LAB — PREGNANCY, URINE: Preg Test, Ur: NEGATIVE

## 2020-10-20 SURGERY — APPENDECTOMY, LAPAROSCOPIC
Anesthesia: General | Site: Abdomen

## 2020-10-20 MED ORDER — LIDOCAINE 2% (20 MG/ML) 5 ML SYRINGE
INTRAMUSCULAR | Status: AC
  Filled 2020-10-20: qty 15

## 2020-10-20 MED ORDER — 0.9 % SODIUM CHLORIDE (POUR BTL) OPTIME
TOPICAL | Status: DC | PRN
  Administered 2020-10-20: 1000 mL

## 2020-10-20 MED ORDER — ORAL CARE MOUTH RINSE: 15.0000 mL | Freq: Once | OROMUCOSAL | Status: AC

## 2020-10-20 MED ORDER — ACETAMINOPHEN 500 MG PO TABS: 1000.0000 mg | ORAL_TABLET | Freq: Once | ORAL | Status: AC

## 2020-10-20 MED ORDER — ROCURONIUM BROMIDE 10 MG/ML (PF) SYRINGE
PREFILLED_SYRINGE | INTRAVENOUS | Status: AC
  Filled 2020-10-20: qty 30

## 2020-10-20 MED ORDER — KETOROLAC TROMETHAMINE 30 MG/ML IJ SOLN
INTRAMUSCULAR | Status: DC | PRN
  Administered 2020-10-20: 30 mg via INTRAVENOUS

## 2020-10-20 MED ORDER — OXYCODONE HCL 5 MG PO TABS: 5.0000 mg | ORAL_TABLET | Freq: Once | ORAL | Status: DC | PRN

## 2020-10-20 MED ORDER — BUPIVACAINE-EPINEPHRINE (PF) 0.25% -1:200000 IJ SOLN
INTRAMUSCULAR | Status: AC
  Filled 2020-10-20: qty 30

## 2020-10-20 MED ORDER — TRAMADOL HCL 50 MG PO TABS
50.0000 mg | ORAL_TABLET | Freq: Four times a day (QID) | ORAL | Status: DC | PRN
Start: 2020-10-20 — End: 2020-10-21
  Administered 2020-10-21: 50 mg via ORAL
  Filled 2020-10-20: qty 1

## 2020-10-20 MED ORDER — IOHEXOL 300 MG/ML  SOLN
100.0000 mL | Freq: Once | INTRAMUSCULAR | Status: AC | PRN
  Administered 2020-10-20: 100 mL via INTRAVENOUS

## 2020-10-20 MED ORDER — ALPRAZOLAM 0.5 MG PO TABS: 0.5000 mg | ORAL_TABLET | Freq: Three times a day (TID) | ORAL | Status: DC | PRN

## 2020-10-20 MED ORDER — ACETAMINOPHEN 500 MG PO TABS
1000.0000 mg | ORAL_TABLET | Freq: Four times a day (QID) | ORAL | Status: DC
  Filled 2020-10-20: qty 2

## 2020-10-20 MED ORDER — AMISULPRIDE (ANTIEMETIC) 5 MG/2ML IV SOLN
INTRAVENOUS | Status: AC
  Filled 2020-10-20: qty 2

## 2020-10-20 MED ORDER — ONDANSETRON HCL 4 MG/2ML IJ SOLN: 4.0000 mg | Freq: Four times a day (QID) | INTRAMUSCULAR | Status: DC | PRN

## 2020-10-20 MED ORDER — LIDOCAINE 2% (20 MG/ML) 5 ML SYRINGE
INTRAMUSCULAR | Status: DC | PRN
  Administered 2020-10-20: 60 mg via INTRAVENOUS

## 2020-10-20 MED ORDER — DEXTROSE-NACL 5-0.45 % IV SOLN: INTRAVENOUS | Status: DC

## 2020-10-20 MED ORDER — MORPHINE SULFATE (PF) 4 MG/ML IV SOLN
4.0000 mg | Freq: Once | INTRAVENOUS | Status: AC
  Administered 2020-10-20: 4 mg via INTRAVENOUS
  Filled 2020-10-20: qty 1

## 2020-10-20 MED ORDER — SODIUM CHLORIDE 0.9 % IV SOLN
1.0000 g | Freq: Once | INTRAVENOUS | Status: AC
  Administered 2020-10-20: 1 g via INTRAVENOUS
  Filled 2020-10-20: qty 10

## 2020-10-20 MED ORDER — AMISULPRIDE (ANTIEMETIC) 5 MG/2ML IV SOLN
10.0000 mg | Freq: Once | INTRAVENOUS | Status: AC | PRN
  Administered 2020-10-20: 10 mg via INTRAVENOUS

## 2020-10-20 MED ORDER — METOPROLOL TARTRATE 5 MG/5ML IV SOLN: 5.0000 mg | Freq: Four times a day (QID) | INTRAVENOUS | Status: DC | PRN

## 2020-10-20 MED ORDER — ACETAMINOPHEN 500 MG PO TABS
ORAL_TABLET | ORAL | Status: AC
  Administered 2020-10-20: 1000 mg via ORAL
  Filled 2020-10-20: qty 2

## 2020-10-20 MED ORDER — LORATADINE 10 MG PO TABS: 10.0000 mg | ORAL_TABLET | Freq: Every day | ORAL | Status: DC

## 2020-10-20 MED ORDER — SIMETHICONE 80 MG PO CHEW: 40.0000 mg | CHEWABLE_TABLET | Freq: Four times a day (QID) | ORAL | Status: DC | PRN

## 2020-10-20 MED ORDER — PROPOFOL 10 MG/ML IV BOLUS
INTRAVENOUS | Status: DC | PRN
  Administered 2020-10-20: 150 mg via INTRAVENOUS

## 2020-10-20 MED ORDER — CHLORHEXIDINE GLUCONATE 0.12 % MT SOLN
OROMUCOSAL | Status: AC
  Administered 2020-10-20: 15 mL via OROMUCOSAL
  Filled 2020-10-20: qty 15

## 2020-10-20 MED ORDER — MIDAZOLAM HCL 2 MG/2ML IJ SOLN
INTRAMUSCULAR | Status: DC | PRN
  Administered 2020-10-20: 2 mg via INTRAVENOUS

## 2020-10-20 MED ORDER — LIDOCAINE 2% (20 MG/ML) 5 ML SYRINGE
INTRAMUSCULAR | Status: AC
  Filled 2020-10-20: qty 10

## 2020-10-20 MED ORDER — ONDANSETRON HCL 4 MG/2ML IJ SOLN
INTRAMUSCULAR | Status: DC | PRN
  Administered 2020-10-20: 4 mg via INTRAVENOUS

## 2020-10-20 MED ORDER — PHENYLEPHRINE 40 MCG/ML (10ML) SYRINGE FOR IV PUSH (FOR BLOOD PRESSURE SUPPORT)
PREFILLED_SYRINGE | INTRAVENOUS | Status: AC
  Filled 2020-10-20: qty 20

## 2020-10-20 MED ORDER — FENTANYL CITRATE (PF) 250 MCG/5ML IJ SOLN
INTRAMUSCULAR | Status: DC | PRN
  Administered 2020-10-20 (×3): 50 ug via INTRAVENOUS
  Administered 2020-10-20: 100 ug via INTRAVENOUS

## 2020-10-20 MED ORDER — ONDANSETRON 4 MG PO TBDP
4.0000 mg | ORAL_TABLET | Freq: Four times a day (QID) | ORAL | Status: DC | PRN
  Administered 2020-10-21: 4 mg via ORAL
  Filled 2020-10-20: qty 1

## 2020-10-20 MED ORDER — ROCURONIUM BROMIDE 10 MG/ML (PF) SYRINGE
PREFILLED_SYRINGE | INTRAVENOUS | Status: DC | PRN
  Administered 2020-10-20: 50 mg via INTRAVENOUS

## 2020-10-20 MED ORDER — OXYCODONE HCL 5 MG PO TABS: 5.0000 mg | ORAL_TABLET | ORAL | Status: DC | PRN

## 2020-10-20 MED ORDER — FENTANYL CITRATE (PF) 250 MCG/5ML IJ SOLN
INTRAMUSCULAR | Status: AC
  Filled 2020-10-20: qty 5

## 2020-10-20 MED ORDER — SUCCINYLCHOLINE CHLORIDE 200 MG/10ML IV SOSY
PREFILLED_SYRINGE | INTRAVENOUS | Status: AC
  Filled 2020-10-20: qty 10

## 2020-10-20 MED ORDER — OXYCODONE HCL 5 MG/5ML PO SOLN: 5.0000 mg | Freq: Once | ORAL | Status: DC | PRN

## 2020-10-20 MED ORDER — DEXAMETHASONE SODIUM PHOSPHATE 10 MG/ML IJ SOLN
INTRAMUSCULAR | Status: DC | PRN
  Administered 2020-10-20: 5 mg via INTRAVENOUS

## 2020-10-20 MED ORDER — ALBUTEROL SULFATE (2.5 MG/3ML) 0.083% IN NEBU: 2.5000 mg | INHALATION_SOLUTION | RESPIRATORY_TRACT | Status: DC | PRN

## 2020-10-20 MED ORDER — ONDANSETRON HCL 4 MG/2ML IJ SOLN
4.0000 mg | Freq: Once | INTRAMUSCULAR | Status: AC
  Administered 2020-10-20: 4 mg via INTRAVENOUS
  Filled 2020-10-20: qty 2

## 2020-10-20 MED ORDER — KETOROLAC TROMETHAMINE 30 MG/ML IJ SOLN: 30.0000 mg | Freq: Once | INTRAMUSCULAR | Status: DC | PRN

## 2020-10-20 MED ORDER — CHLORHEXIDINE GLUCONATE 0.12 % MT SOLN: 15.0000 mL | Freq: Once | OROMUCOSAL | Status: AC

## 2020-10-20 MED ORDER — KETOROLAC TROMETHAMINE 15 MG/ML IJ SOLN
15.0000 mg | Freq: Four times a day (QID) | INTRAMUSCULAR | Status: DC | PRN
  Administered 2020-10-20 – 2020-10-21 (×2): 15 mg via INTRAVENOUS
  Filled 2020-10-20 (×2): qty 1

## 2020-10-20 MED ORDER — LACTATED RINGERS IV SOLN: INTRAVENOUS | Status: DC

## 2020-10-20 MED ORDER — MIDAZOLAM HCL 2 MG/2ML IJ SOLN
INTRAMUSCULAR | Status: AC
  Filled 2020-10-20: qty 2

## 2020-10-20 MED ORDER — DIPHENHYDRAMINE HCL 25 MG PO CAPS: 25.0000 mg | ORAL_CAPSULE | Freq: Four times a day (QID) | ORAL | Status: DC | PRN

## 2020-10-20 MED ORDER — CYCLOBENZAPRINE HCL 10 MG PO TABS: 10.0000 mg | ORAL_TABLET | Freq: Two times a day (BID) | ORAL | Status: DC | PRN

## 2020-10-20 MED ORDER — SODIUM CHLORIDE 0.9 % IR SOLN
Status: DC | PRN
  Administered 2020-10-20: 1000 mL

## 2020-10-20 MED ORDER — HYDROMORPHONE HCL 1 MG/ML IJ SOLN: 0.2500 mg | INTRAMUSCULAR | Status: DC | PRN

## 2020-10-20 MED ORDER — PROMETHAZINE HCL 25 MG/ML IJ SOLN: 6.2500 mg | INTRAMUSCULAR | Status: DC | PRN

## 2020-10-20 MED ORDER — ROCURONIUM BROMIDE 10 MG/ML (PF) SYRINGE
PREFILLED_SYRINGE | INTRAVENOUS | Status: AC
  Filled 2020-10-20: qty 10

## 2020-10-20 MED ORDER — BUPIVACAINE-EPINEPHRINE 0.25% -1:200000 IJ SOLN
INTRAMUSCULAR | Status: DC | PRN
  Administered 2020-10-20: 30 mL

## 2020-10-20 MED ORDER — VALACYCLOVIR HCL 500 MG PO TABS
1000.0000 mg | ORAL_TABLET | Freq: Every day | ORAL | Status: DC
  Filled 2020-10-20: qty 2

## 2020-10-20 MED ORDER — MORPHINE SULFATE (PF) 2 MG/ML IV SOLN: 2.0000 mg | INTRAVENOUS | Status: DC | PRN

## 2020-10-20 MED ORDER — PHENYLEPHRINE 40 MCG/ML (10ML) SYRINGE FOR IV PUSH (FOR BLOOD PRESSURE SUPPORT)
PREFILLED_SYRINGE | INTRAVENOUS | Status: AC
  Filled 2020-10-20: qty 10

## 2020-10-20 MED ORDER — ENOXAPARIN SODIUM 40 MG/0.4ML IJ SOSY: 40.0000 mg | PREFILLED_SYRINGE | INTRAMUSCULAR | Status: DC

## 2020-10-20 MED ORDER — DIPHENHYDRAMINE HCL 50 MG/ML IJ SOLN: 25.0000 mg | Freq: Four times a day (QID) | INTRAMUSCULAR | Status: DC | PRN

## 2020-10-20 MED ORDER — MEPERIDINE HCL 25 MG/ML IJ SOLN: 6.2500 mg | INTRAMUSCULAR | Status: DC | PRN

## 2020-10-20 MED ORDER — SUGAMMADEX SODIUM 200 MG/2ML IV SOLN
INTRAVENOUS | Status: DC | PRN
  Administered 2020-10-20: 200 mg via INTRAVENOUS

## 2020-10-20 SURGICAL SUPPLY — 40 items
APPLIER CLIP 5 13 M/L LIGAMAX5 (MISCELLANEOUS)
BAG COUNTER SPONGE SURGICOUNT (BAG) ×2 IMPLANT
CANISTER SUCT 3000ML PPV (MISCELLANEOUS) ×2 IMPLANT
CHLORAPREP W/TINT 26 (MISCELLANEOUS) ×2 IMPLANT
CLIP APPLIE 5 13 M/L LIGAMAX5 (MISCELLANEOUS) IMPLANT
CLIP LIGATING HEMO LOK XL GOLD (MISCELLANEOUS) ×2 IMPLANT
COVER SURGICAL LIGHT HANDLE (MISCELLANEOUS) ×2 IMPLANT
CUTTER FLEX LINEAR 45M (STAPLE) ×2 IMPLANT
DERMABOND ADVANCED (GAUZE/BANDAGES/DRESSINGS) ×1
DERMABOND ADVANCED .7 DNX12 (GAUZE/BANDAGES/DRESSINGS) ×1 IMPLANT
ELECT REM PT RETURN 9FT ADLT (ELECTROSURGICAL) ×2
ELECTRODE REM PT RTRN 9FT ADLT (ELECTROSURGICAL) ×1 IMPLANT
ENDOLOOP SUT PDS II  0 18 (SUTURE)
ENDOLOOP SUT PDS II 0 18 (SUTURE) IMPLANT
GLOVE SURG POLYISO LF SZ7 (GLOVE) ×2 IMPLANT
GLOVE SURG UNDER POLY LF SZ7 (GLOVE) ×2 IMPLANT
GOWN STRL REUS W/ TWL LRG LVL3 (GOWN DISPOSABLE) ×3 IMPLANT
GOWN STRL REUS W/TWL LRG LVL3 (GOWN DISPOSABLE) ×3
GRASPER SUT TROCAR 14GX15 (MISCELLANEOUS) ×2 IMPLANT
KIT BASIN OR (CUSTOM PROCEDURE TRAY) ×2 IMPLANT
KIT TURNOVER KIT B (KITS) ×2 IMPLANT
NEEDLE 22X1 1/2 (OR ONLY) (NEEDLE) ×2 IMPLANT
NS IRRIG 1000ML POUR BTL (IV SOLUTION) ×2 IMPLANT
PAD ARMBOARD 7.5X6 YLW CONV (MISCELLANEOUS) ×4 IMPLANT
POUCH RETRIEVAL ECOSAC 10 (ENDOMECHANICALS) ×1 IMPLANT
POUCH RETRIEVAL ECOSAC 10MM (ENDOMECHANICALS) ×1
RELOAD STAPLE TA45 3.5 REG BLU (ENDOMECHANICALS) ×2 IMPLANT
SCISSORS LAP 5X35 DISP (ENDOMECHANICALS) ×2 IMPLANT
SET IRRIG TUBING LAPAROSCOPIC (IRRIGATION / IRRIGATOR) ×2 IMPLANT
SET TUBE SMOKE EVAC HIGH FLOW (TUBING) ×2 IMPLANT
SLEEVE ENDOPATH XCEL 5M (ENDOMECHANICALS) ×2 IMPLANT
SPECIMEN JAR SMALL (MISCELLANEOUS) ×2 IMPLANT
SUT MNCRL AB 4-0 PS2 18 (SUTURE) ×2 IMPLANT
TOWEL GREEN STERILE (TOWEL DISPOSABLE) ×2 IMPLANT
TOWEL GREEN STERILE FF (TOWEL DISPOSABLE) ×2 IMPLANT
TRAY FOLEY W/BAG SLVR 14FR (SET/KITS/TRAYS/PACK) IMPLANT
TRAY LAPAROSCOPIC MC (CUSTOM PROCEDURE TRAY) ×2 IMPLANT
TROCAR XCEL 12X100 BLDLESS (ENDOMECHANICALS) ×2 IMPLANT
TROCAR XCEL NON-BLD 5MMX100MML (ENDOMECHANICALS) ×2 IMPLANT
WATER STERILE IRR 1000ML POUR (IV SOLUTION) ×2 IMPLANT

## 2020-10-20 NOTE — ED Triage Notes (Signed)
Lower centralized abdominal pain radiating throughout pelvis. Was prescribed 2 antibiotics to treat a UTI this week, has not helped. Loose stools with the pain, all starting Monday. Denies history of same. Hx of fallopian tube removal, uterus still intact.

## 2020-10-20 NOTE — ED Provider Notes (Signed)
[facility]    CSN: [id number] Arrival date & time: 10/20/20  0956      History   Chief Complaint Chief Complaint  Patient presents with   Abdominal Pain    HPI Brittney [name] is a 50 y.o. female.   Patient here today for evaluation of lower abdominal pain that has been present for the last 5 days.  She reports that she has had multiple laboratory tests at this time and initially it was thought that she might have a UTI and was treated for the same.  She was prescribed Keflex and then this was changed to Cipro however last culture came back negative.  She has been trying to use probiotics without significant relief.  She reports she is had some looser stools but only about 1 bowel movement a day.  She states stools look orange in color, but denies any blood in stool or dark tarry stools.  She has not had fever or chills.  She has not had vomiting but does report decreased appetite.  She does have history of surgery to her abdomen and pelvis.  The history is provided by the patient.  Abdominal Pain Associated symptoms: no chills, no constipation, no diarrhea, no dysuria, no fever, no shortness of breath and no vomiting    Past Medical History:  Diagnosis Date   Anemia    Anxiety    Asthma    Insomnia    Obesity (BMI 35.0-39.9 without comorbidity)    Oral herpes     Patient Active Problem List   Diagnosis Date Noted   COVID-19 virus infection 05/10/2020   Hyperlipidemia 02/24/2020   Obesity (BMI 35.0-39.9 without comorbidity)    Insomnia    Oral herpes    Anemia    Asthma     Past Surgical History:  Procedure Laterality Date   CHOLECYSTECTOMY     lapband     TONSILLECTOMY     tummy tuck      OB History   No obstetric history on file.      Home Medications    Prior to Admission medications   Medication Sig Start Date End Date Taking? Authorizing Provider  albuterol (VENTOLIN HFA) 108 (90 Base) MCG/ACT inhaler Inhale 2 puffs into the  lungs every 4 (four) hours as needed for wheezing or shortness of breath. 05/10/20   Laurey Morale, MD  ALPRAZolam Duanne Moron) 0.5 MG tablet Take 1 tablet (0.5 mg total) by mouth daily as needed. 02/24/20   Isaac Bliss, Rayford Halsted, MD  Ascorbic Acid (VITAMIN C PO) Take by mouth.    [provider]  ciprofloxacin (CIPRO) 500 MG tablet Take 1 tablet (500 mg total) by mouth 2 (two) times daily for 7 days. 10/18/20 10/25/20  Billie Ruddy, MD  cyclobenzaprine (FLEXERIL) 10 MG tablet Take 1 tablet (10 mg total) by mouth 2 (two) times daily as needed for muscle spasms. 09/21/19   Wardell Honour, MD  loratadine (CLARITIN) 10 MG tablet Take 10 mg by mouth daily.    [provider]  Multiple Vitamin (MULTIVITAMIN) capsule Take 1 capsule by mouth daily.    [provider]  phenazopyridine (PYRIDIUM) 100 MG tablet Take 1 tablet (100 mg total) by mouth 3 (three) times daily as needed for pain. 10/16/20   [name], PA-C  Turmeric (QC TUMERIC COMPLEX PO) Take by mouth.    [provider]  valACYclovir (VALTREX) 1000 MG tablet TAKE 1 TABLET (1,000 MG TOTAL) BY  MOUTH DAILY AS NEEDED. 12/14/19   Isaac Bliss, Rayford Halsted, MD  VITAMIN D PO Take by mouth.    [provider]    Family History Family History  Problem Relation Age of Onset   Healthy Mother    Cancer Father    Pancreatic cancer Father    Breast cancer Maternal Grandmother    Colon cancer Neg Hx    Colon polyps Neg Hx    Esophageal cancer Neg Hx    Rectal cancer Neg Hx    Stomach cancer Neg Hx     Social History Social History   Tobacco Use   Smoking status: Never   Smokeless tobacco: Never  Vaping Use   Vaping Use: Never used  Substance Use Topics   Alcohol use: Not Currently   Drug use: Never     Allergies   Peanut-containing drug products and Sulfa antibiotics   Review of Systems Review of Systems  Constitutional:  Negative for chills and fever.  Eyes:  Negative  for discharge and redness.  Respiratory:  Negative for shortness of breath.   Gastrointestinal:  Positive for abdominal pain. Negative for abdominal distention, blood in stool, constipation, diarrhea and vomiting.  Genitourinary:  Negative for dysuria.    Physical Exam Triage Vital Signs ED Triage Vitals  Enc Vitals Group     BP 10/20/20 1014 113/81     Pulse Rate 10/20/20 1014 66     Resp 10/20/20 1014 20     Temp 10/20/20 1014 97.9 F (36.6 C)     Temp Source 10/20/20 1014 Oral     SpO2 10/20/20 1014 97 %     Weight --      Height --      Head Circumference --      Peak Flow --      Pain Score 10/20/20 1022 8     Pain Loc --      Pain Edu? --      Excl. in [place]? --    No data found.  Updated Vital Signs BP 113/81 (BP Location: Right Arm)   Pulse 66   Temp 97.9 F (36.6 C) (Oral)   Resp 20   SpO2 97%      Physical Exam Vitals and nursing note reviewed.  Constitutional:      General: She is not in acute distress.    Appearance: She is well-developed. She is not ill-appearing.  HENT:     Head: Normocephalic and atraumatic.     Nose: No congestion.  Eyes:     Conjunctiva/sclera: Conjunctivae normal.  Cardiovascular:     Rate and Rhythm: Normal rate and regular rhythm.     Heart sounds: Normal heart sounds. No murmur heard. Pulmonary:     Effort: Pulmonary effort is normal. No respiratory distress.     Breath sounds: Normal breath sounds. No wheezing, rhonchi or rales.  Abdominal:     General: Abdomen is flat. Bowel sounds are normal. There is no distension.     Palpations: Abdomen is soft.     Tenderness: There is abdominal tenderness (diffusely across lower abdomen). There is guarding (mild to lower abdomen).  Skin:    General: Skin is warm and dry.  Neurological:     Mental Status: She is alert.  Psychiatric:        Mood and Affect: Mood normal.        Behavior: Behavior normal.     UC Treatments / Results  Labs (all labs  ordered are listed, but only  abnormal results are displayed) Labs Reviewed - No data to display  EKG   Radiology DG Abd 2 Views  Result Date: 10/20/2020 CLINICAL DATA:  Abdominal pain EXAM: ABDOMEN - 2 VIEW COMPARISON:  None. FINDINGS: The bowel gas pattern is normal. There is no evidence of free air. No radio-opaque calculi or other significant radiographic abnormality is seen. IMPRESSION: Nonobstructive pattern of bowel gas. No large burden of stool. No free air in the abdomen. Electronically Signed   By: Delanna Ahmadi M.D.   On: 10/20/2020 11:26    Procedures Procedures (including critical care time)  Medications Ordered in UC Medications - No data to display  Initial Impression / Assessment and Plan / UC Course  I have reviewed the triage vital signs and the nursing notes.  Pertinent labs & imaging results that were available during my care of the patient were reviewed by me and considered in my medical decision making (see chart for details).  Unknown etiology of symptoms, x-ray in office with nonobstructive pattern of bowel gas and no significant stool burden.  Discussed options with patient including follow-up with her primary care doctor, versus evaluation in the ED for CT scan.  She states she had CT scan ordered last night but was unable to wait in the ED.  She elects to return back to the ED for CT, but will report to [facility] as they have less of a wait time and I suspect she will not need admission.   Final Clinical Impressions(s) / UC Diagnoses   Final diagnoses:  Lower abdominal pain   Discharge Instructions   None    ED Prescriptions   None    PDMP not reviewed this encounter.   Francene Finders, PA-C 10/20/20 1143

## 2020-10-20 NOTE — ED Triage Notes (Signed)
Pt reports lower abdominal pain and swelling since Monday. Pt was prescribed antibiotic for a UTI via virtual visit, but symptoms have not resolved. She also reports some loose stools. Pt was just seen at [facility] and had an abdominal X-ray performed and was sent here for further evaluation and CT.

## 2020-10-20 NOTE — ED Provider Notes (Signed)
[facility] Provider Note   CSN: [id number] Arrival date & time: 10/20/20  1159     History Chief Complaint  Patient presents with   Abdominal Pain    Brittney [name] is a 50 y.o. female.  50 year old female presents with abdominal pain x1 week.  Was prescribed antibiotic for UTI.  Continues to no lower abdominal discomfort.  No emesis or fever recently.  Has had some loose stools.  Was seen in urgent care and told to come in for abdominal CT.  She is status post cholecystectomy.  Denies any new UTI symptoms.  No vaginal bleeding or discharge.      Past Medical History:  Diagnosis Date   Anemia    Anxiety    Asthma    Insomnia    Obesity (BMI 35.0-39.9 without comorbidity)    Oral herpes     Patient Active Problem List   Diagnosis Date Noted   COVID-19 virus infection 05/10/2020   Hyperlipidemia 02/24/2020   Obesity (BMI 35.0-39.9 without comorbidity)    Insomnia    Oral herpes    Anemia    Asthma     Past Surgical History:  Procedure Laterality Date   CHOLECYSTECTOMY     lapband     TONSILLECTOMY     tummy tuck       OB History   No obstetric history on file.     Family History  Problem Relation Age of Onset   Healthy Mother    Cancer Father    Pancreatic cancer Father    Breast cancer Maternal Grandmother    Colon cancer Neg Hx    Colon polyps Neg Hx    Esophageal cancer Neg Hx    Rectal cancer Neg Hx    Stomach cancer Neg Hx     Social History   Tobacco Use   Smoking status: Never   Smokeless tobacco: Never  Vaping Use   Vaping Use: Never used  Substance Use Topics   Alcohol use: Not Currently   Drug use: Never    Home Medications Prior to Admission medications   Medication Sig Start Date End Date Taking? Authorizing Provider  albuterol (VENTOLIN HFA) 108 (90 Base) MCG/ACT inhaler Inhale 2 puffs into the lungs every 4 (four) hours as needed for wheezing or shortness of breath. 05/10/20   Laurey Morale, MD  ALPRAZolam Duanne Moron) 0.5 MG tablet Take 1 tablet (0.5 mg total) by mouth daily as needed. 02/24/20   Isaac Bliss, Rayford Halsted, MD  Ascorbic Acid (VITAMIN C PO) Take by mouth.    [provider]  ciprofloxacin (CIPRO) 500 MG tablet Take 1 tablet (500 mg total) by mouth 2 (two) times daily for 7 days. 10/18/20 10/25/20  Billie Ruddy, MD  cyclobenzaprine (FLEXERIL) 10 MG tablet Take 1 tablet (10 mg total) by mouth 2 (two) times daily as needed for muscle spasms. 09/21/19   Wardell Honour, MD  loratadine (CLARITIN) 10 MG tablet Take 10 mg by mouth daily.    [provider]  Multiple Vitamin (MULTIVITAMIN) capsule Take 1 capsule by mouth daily.    [provider]  phenazopyridine (PYRIDIUM) 100 MG tablet Take 1 tablet (100 mg total) by mouth 3 (three) times daily as needed for pain. 10/16/20   [name], PA-C  Turmeric (QC TUMERIC COMPLEX PO) Take by mouth.    [provider]  valACYclovir (VALTREX) 1000 MG tablet TAKE 1 TABLET (1,000 MG TOTAL) BY MOUTH DAILY  AS NEEDED. 12/14/19   Isaac Bliss, Rayford Halsted, MD  VITAMIN D PO Take by mouth.    [provider]    Allergies    Peanut-containing drug products and Sulfa antibiotics  Review of Systems   Review of Systems  All other systems reviewed and are negative.  Physical Exam Updated Vital Signs BP (!) 137/115 (BP Location: Left Arm)   Pulse 67   Temp 98.2 F (36.8 C) (Oral)   Resp 16   SpO2 98%   Physical Exam Vitals and nursing note reviewed.  Constitutional:      General: She is not in acute distress.    Appearance: Normal appearance. She is well-developed. She is not toxic-appearing.  HENT:     Head: Normocephalic and atraumatic.  Eyes:     General: Lids are normal.     Conjunctiva/sclera: Conjunctivae normal.     Pupils: Pupils are equal, round, and reactive to light.  Neck:     Thyroid: No thyroid mass.     Trachea: No tracheal deviation.  Cardiovascular:      Rate and Rhythm: Normal rate and regular rhythm.     Heart sounds: Normal heart sounds. No murmur heard.   No gallop.  Pulmonary:     Effort: Pulmonary effort is normal. No respiratory distress.     Breath sounds: Normal breath sounds. No stridor. No decreased breath sounds, wheezing, rhonchi or rales.  Abdominal:     General: There is no distension.     Palpations: Abdomen is soft.     Tenderness: There is no abdominal tenderness. There is no rebound.    Musculoskeletal:        General: No tenderness. Normal range of motion.     Cervical back: Normal range of motion and neck supple.  Skin:    General: Skin is warm and dry.     Findings: No abrasion or rash.  Neurological:     Mental Status: She is alert and oriented to person, place, and time. Mental status is at baseline.     GCS: GCS eye subscore is 4. GCS verbal subscore is 5. GCS motor subscore is 6.     Cranial Nerves: Cranial nerves are intact. No cranial nerve deficit.     Sensory: No sensory deficit.     Motor: Motor function is intact.  Psychiatric:        Attention and Perception: Attention normal.        Speech: Speech normal.        Behavior: Behavior normal.    ED Results / Procedures / Treatments   Labs (all labs ordered are listed, but only abnormal results are displayed) Labs Reviewed  COMPREHENSIVE METABOLIC PANEL - Abnormal; Notable for the following components:      Result Value   AST 14 (*)    All other components within normal limits  LIPASE, BLOOD  CBC  URINALYSIS, ROUTINE W REFLEX MICROSCOPIC  PREGNANCY, URINE    EKG None  Radiology DG Abd 2 Views  Result Date: 10/20/2020 CLINICAL DATA:  Abdominal pain EXAM: ABDOMEN - 2 VIEW COMPARISON:  None. FINDINGS: The bowel gas pattern is normal. There is no evidence of free air. No radio-opaque calculi or other significant radiographic abnormality is seen. IMPRESSION: Nonobstructive pattern of bowel gas. No large burden of stool. No free air in the  abdomen. Electronically Signed   By: Delanna Ahmadi M.D.   On: 10/20/2020 11:26    Procedures Procedures   Medications Ordered  in ED Medications - No data to display  ED Course  I have reviewed the triage vital signs and the nursing notes.  Pertinent labs & imaging results that were available during my care of the patient were reviewed by me and considered in my medical decision making (see chart for details).    MDM Rules/Calculators/A&P                           Labs here wnl Abd ct pending Dr Maryan Rued to r/u Final Clinical Impression(s) / ED Diagnoses Final diagnoses:  None    Rx / DC Orders ED Discharge Orders     None        Lacretia Leigh, MD 10/20/20 1517

## 2020-10-20 NOTE — Anesthesia Procedure Notes (Signed)
Procedure Name: Intubation Date/Time: 10/20/2020 8:12 PM Performed by: Janace Litten, CRNA Pre-anesthesia Checklist: Patient identified, Emergency Drugs available, Suction available and Patient being monitored Patient Re-evaluated:Patient Re-evaluated prior to induction Oxygen Delivery Method: Circle System Utilized Preoxygenation: Pre-oxygenation with 100% oxygen Induction Type: IV induction Ventilation: Mask ventilation without difficulty Laryngoscope Size: Mac and 3 Grade View: Grade I Tube type: Oral Tube size: 7.0 mm Number of attempts: 1 Airway Equipment and Method: Stylet and Oral airway Placement Confirmation: ETT inserted through vocal cords under direct vision, positive ETCO2 and breath sounds checked- equal and bilateral Secured at: 22 cm Tube secured with: Tape Dental Injury: Teeth and Oropharynx as per pre-operative assessment

## 2020-10-20 NOTE — Anesthesia Preprocedure Evaluation (Addendum)
Anesthesia Evaluation  Patient identified by MRN, date of birth, ID band Patient awake    Reviewed: Allergy & Precautions, Patient's Chart, lab work & pertinent test results  Airway Mallampati: II  TM Distance: >3 FB Neck ROM: Full    Dental  (+) Teeth Intact   Pulmonary asthma ,    Pulmonary exam normal        Cardiovascular negative cardio ROS   Rhythm:Regular Rate:Normal     Neuro/Psych PSYCHIATRIC DISORDERS Anxiety negative neurological ROS     GI/Hepatic Neg liver ROS, Appendicitis   S/p lap band   Endo/Other  Obesity BMI 38  Renal/GU negative Renal ROS  negative genitourinary   Musculoskeletal negative musculoskeletal ROS (+)   Abdominal (+) + obese,   Peds  Hematology  (+) anemia , hct 42.2   Anesthesia Other Findings   Reproductive/Obstetrics Urine preg neg 10/14                            Anesthesia Physical Anesthesia Plan  ASA: 2  Anesthesia Plan: General   Post-op Pain Management:    Induction: Intravenous  PONV Risk Score and Plan: 4 or greater and Ondansetron, Dexamethasone, Midazolam and Treatment may vary due to age or medical condition  Airway Management Planned: Oral ETT  Additional Equipment: None  Intra-op Plan:   Post-operative Plan: Extubation in OR  Informed Consent: I have reviewed the patients History and Physical, chart, labs and discussed the procedure including the risks, benefits and alternatives for the proposed anesthesia with the patient or authorized representative who has indicated his/her understanding and acceptance.     Dental advisory given  Plan Discussed with: CRNA  Anesthesia Plan Comments: (Lab Results      Component                Value               Date                      WBC                      7.1                 10/20/2020                HGB                      13.6                10/20/2020                HCT                       42.2                10/20/2020                MCV                      90.8                10/20/2020                PLT                      320  10/20/2020           Lab Results      Component                Value               Date                      NA                       138                 10/20/2020                K                        4.0                 10/20/2020                CO2                      28                  10/20/2020                GLUCOSE                  81                  10/20/2020                BUN                      9                   10/20/2020                CREATININE               0.68                10/20/2020                CALCIUM                  9.6                 10/20/2020                GFRNONAA                 >60                 10/20/2020          )       Anesthesia Quick Evaluation

## 2020-10-20 NOTE — H&P (Signed)
Reason for Consult:abdominal pain Referring Provider: Lacretia Leigh  Brittney [name] is an 50 y.o. female.  HPI: 50 yo female with 4 days of nausea progressing into pain. She was initially treated with antibiotics for UTI without improvement. She went to the ED and then to urgent care where she was finally diagnosed with appendicitis. Pain is in her right lower quadrant. It is worse with movement. It is improved with rest and pain medication. It does not radiate.  Past Medical History:  Diagnosis Date   Anemia    Anxiety    Asthma    Insomnia    Obesity (BMI 35.0-39.9 without comorbidity)    Oral herpes     Past Surgical History:  Procedure Laterality Date   CHOLECYSTECTOMY     lapband     TONSILLECTOMY     tummy tuck      Family History  Problem Relation Age of Onset   Healthy Mother    Cancer Father    Pancreatic cancer Father    Breast cancer Maternal Grandmother    Colon cancer Neg Hx    Colon polyps Neg Hx    Esophageal cancer Neg Hx    Rectal cancer Neg Hx    Stomach cancer Neg Hx     Social History:  reports that she has never smoked. She has never used smokeless tobacco. She reports that she does not currently use alcohol. She reports that she does not use drugs.  Allergies:  Allergies  Allergen Reactions   Peanut-Containing Drug Products Hives   Sulfa Antibiotics Nausea And Vomiting    Medications: I have reviewed the patient's current medications.  Results for orders placed or performed during the hospital encounter of 10/20/20 (from the past 48 hour(s))  Lipase, blood     Status: None   Collection Time: 10/20/20 12:19 PM  Result Value Ref Range   Lipase 11 11 - 51 U/L    Comment: Performed at [facility], [address], [place], [place] [address]  Comprehensive metabolic panel     Status: Abnormal   Collection Time: 10/20/20 12:19 PM  Result Value Ref Range   Sodium 138 135 - 145 mmol/L   Potassium 4.0 3.5 - 5.1 mmol/L    Chloride 100 98 - 111 mmol/L   CO2 28 22 - 32 mmol/L   Glucose, Bld 81 70 - 99 mg/dL    Comment: Glucose reference range applies only to samples taken after fasting for at least 8 hours.   BUN 9 6 - 20 mg/dL   Creatinine, Ser 0.68 0.44 - 1.00 mg/dL   Calcium 9.6 8.9 - 10.3 mg/dL   Total Protein 7.9 6.5 - 8.1 g/dL   Albumin 4.4 3.5 - 5.0 g/dL   AST 14 (L) 15 - 41 U/L   ALT 12 0 - 44 U/L   Alkaline Phosphatase 72 38 - 126 U/L   Total Bilirubin 0.6 0.3 - 1.2 mg/dL   GFR, Estimated >60 >60 mL/min    Comment: (NOTE) Calculated using the CKD-EPI Creatinine Equation (2021)    Anion gap 10 5 - 15    Comment: Performed at [facility], [address] [place], [place] [address]  CBC     Status: None   Collection Time: 10/20/20 12:19 PM  Result Value Ref Range   WBC 7.1 4.0 - 10.5 K/uL   RBC 4.65 3.87 - 5.11 MIL/uL   Hemoglobin 13.6 12.0 - 15.0 g/dL   HCT 42.2 36.0 -  46.0 %   MCV 90.8 80.0 - 100.0 fL   MCH 29.2 26.0 - 34.0 pg   MCHC 32.2 30.0 - 36.0 g/dL   RDW 13.1 11.5 - 15.5 %   Platelets 320 150 - 400 K/uL   nRBC 0.0 0.0 - 0.2 %    Comment: Performed at [facility], [address], [place], [place] [address]  Urinalysis, Routine w reflex microscopic Urine, Clean Catch     Status: Abnormal   Collection Time: 10/20/20  1:35 PM  Result Value Ref Range   Color, Urine YELLOW YELLOW   APPearance CLEAR CLEAR   Specific Gravity, Urine 1.017 1.005 - 1.030   pH 6.0 5.0 - 8.0   Glucose, UA NEGATIVE NEGATIVE mg/dL   Hgb urine dipstick NEGATIVE NEGATIVE   Bilirubin Urine NEGATIVE NEGATIVE   Ketones, ur NEGATIVE NEGATIVE mg/dL   Protein, ur TRACE (A) NEGATIVE mg/dL   Nitrite NEGATIVE NEGATIVE   Leukocytes,Ua NEGATIVE NEGATIVE    Comment: Performed at [facility], [address] [place], [place] [address]  Pregnancy, urine     Status: None   Collection Time: 10/20/20  1:36 PM  Result Value Ref Range   Preg Test, Ur  NEGATIVE NEGATIVE    Comment:        THE SENSITIVITY OF THIS METHODOLOGY IS >20 mIU/mL. Performed at [facility], [address] [place], [place] [address]   Resp Panel by RT-PCR (Flu A&B, Covid) Nasopharyngeal Swab     Status: None   Collection Time: 10/20/20  5:04 PM   Specimen: Nasopharyngeal Swab; Nasopharyngeal(NP) swabs in vial transport medium  Result Value Ref Range   SARS Coronavirus 2 by RT PCR NEGATIVE NEGATIVE    Comment: (NOTE) SARS-CoV-2 target nucleic acids are NOT DETECTED.  The SARS-CoV-2 RNA is generally detectable in upper respiratory specimens during the acute phase of infection. The lowest concentration of SARS-CoV-2 viral copies this assay can detect is 138 copies/mL. A negative result does not preclude SARS-Cov-2 infection and should not be used as the sole basis for treatment or other patient management decisions. A negative result may occur with  improper specimen collection/handling, submission of specimen other than nasopharyngeal swab, presence of viral mutation(s) within the areas targeted by this assay, and inadequate number of viral copies(<138 copies/mL). A negative result must be combined with clinical observations, patient history, and epidemiological information. The expected result is Negative.  Fact Sheet for Patients:  EntrepreneurPulse.com.au  Fact Sheet for Healthcare Providers:  IncredibleEmployment.be  This test is no t yet approved or cleared by the [place] FDA and  has been authorized for detection and/or diagnosis of SARS-CoV-2 by FDA under an Emergency Use Authorization (EUA). This EUA will remain  in effect (meaning this test can be used) for the duration of the COVID-19 declaration under Section 564(b)(1) of the Act, 21 U.S.C.section 360bbb-3(b)(1), unless the authorization is terminated  or revoked sooner.       Influenza A by PCR NEGATIVE NEGATIVE   Influenza B  by PCR NEGATIVE NEGATIVE    Comment: (NOTE) The Xpert Xpress SARS-CoV-2/FLU/RSV plus assay is intended as an aid in the diagnosis of influenza from Nasopharyngeal swab specimens and should not be used as a sole basis for treatment. Nasal washings and aspirates are unacceptable for Xpert Xpress SARS-CoV-2/FLU/RSV testing.  Fact Sheet for Patients: EntrepreneurPulse.com.au  Fact Sheet for Healthcare Providers: IncredibleEmployment.be  This test is not yet approved or cleared by the [place] FDA and has been  authorized for detection and/or diagnosis of SARS-CoV-2 by FDA under an Emergency Use Authorization (EUA). This EUA will remain in effect (meaning this test can be used) for the duration of the COVID-19 declaration under Section 564(b)(1) of the Act, 21 U.S.C. section 360bbb-3(b)(1), unless the authorization is terminated or revoked.  Performed at [facility], [address], [place], [place] [address]     CT Abdomen Pelvis W Contrast  Result Date: 10/20/2020 CLINICAL DATA:  Lower central abdominal pain radiating throughout pelvis EXAM: CT ABDOMEN AND PELVIS WITH CONTRAST TECHNIQUE: Multidetector CT imaging of the abdomen and pelvis was performed using the standard protocol following bolus administration of intravenous contrast. CONTRAST:  15mL OMNIPAQUE IOHEXOL 300 MG/ML  SOLN COMPARISON:  None. FINDINGS: Lower chest: The lung bases are clear. The imaged heart is unremarkable. Hepatobiliary: The liver is unremarkable. The gallbladder is surgically absent. There is no biliary ductal dilatation. Pancreas: Unremarkable. Spleen: Unremarkable. Adrenals/Urinary Tract: The adrenals are unremarkable. The kidneys are unremarkable, with no focal lesion, stone, hydronephrosis, or hydroureter. The bladder is unremarkable. Stomach/Bowel: There is mild thickening of the distal esophageal wall. The patient is status post gastric band  surgery. The band and associated port in the subcutaneous fat of the anterior abdomen are within expected limits. The stomach is otherwise unremarkable. There is no evidence of bowel obstruction. The appendix is dilated measuring up to 1.4 cm with associated wall thickening, mucosal hyperemia, and surrounding inflammatory fat stranding. There is no evidence of organized or drainable fluid collection or perforation. Vascular/Lymphatic: The abdominal aorta is nonaneurysmal. The major branch vessels are patent. The main portal and splenic veins are patent. Reproductive: The uterus and adnexa are unremarkable. Other: There is small volume free fluid in the pelvis, likely reactive. There is no free intraperitoneal air. Musculoskeletal: There is no acute osseous abnormality or aggressive osseous lesion. IMPRESSION: 1. Acute uncomplicated appendicitis. 2. Mild thickening of the distal esophageal wall can be seen with esophagitis. Electronically Signed   By: Valetta Mole M.D.   On: 10/20/2020 15:54   DG Abd 2 Views  Result Date: 10/20/2020 CLINICAL DATA:  Abdominal pain EXAM: ABDOMEN - 2 VIEW COMPARISON:  None. FINDINGS: The bowel gas pattern is normal. There is no evidence of free air. No radio-opaque calculi or other significant radiographic abnormality is seen. IMPRESSION: Nonobstructive pattern of bowel gas. No large burden of stool. No free air in the abdomen. Electronically Signed   By: Delanna Ahmadi M.D.   On: 10/20/2020 11:26    ROS  PE Blood pressure 125/65, pulse 60, temperature 97.7 F (36.5 C), temperature source Oral, resp. rate 17, last menstrual period 07/20/2020, SpO2 100 %. Constitutional: NAD; conversant; no deformities Eyes: Moist conjunctiva; no lid lag; anicteric; PERRL Neck: Trachea midline; no thyromegaly Lungs: Normal respiratory effort; no tactile fremitus CV: RRR; no palpable thrills; no pitting edema GI: Abd right lower quadrant tenderness; no palpable hepatosplenomegaly MSK:  Normal gait; no clubbing/cyanosis Psychiatric: Appropriate affect; alert and oriented x3 Lymphatic: No palpable cervical or axillary lymphadenopathy Skin: No major subcutaneous nodules. Warm and dry   Assessment/Plan: 50 yo female with acute appendicitis -IV rocephin given in ED -we discussed the details of the procedure; that it would be done under general anesthesia, that we would attempt to do the procedure laparoscopically. That the appendix would be isolated from the large and small intestine and then ligated and removed. We discussed the reason for this is to avoid rupture and infection and resolve the pains. We discussed  risks of infection, abscess, injury to intestines or urinary structures, and need for open incision. She showed good understanding and wanted to proceed. -obs stay  Brittney [name] 10/20/2020, 7:28 PM

## 2020-10-20 NOTE — Transfer of Care (Signed)
Immediate Anesthesia Transfer of Care Note  Patient: Brittney [name]  Procedure(s) Performed: APPENDECTOMY LAPAROSCOPIC (Abdomen)  Patient Location: PACU  Anesthesia Type:General  Level of Consciousness: drowsy, patient cooperative and responds to stimulation  Airway & Oxygen Therapy: Patient Spontanous Breathing  Post-op Assessment: Report given to RN and Post -op Vital signs reviewed and stable  Post vital signs: Reviewed and stable  Last Vitals:  Vitals Value Taken Time  BP 138/86 10/20/20 2112  Temp    Pulse 84 10/20/20 2114  Resp 15 10/20/20 2114  SpO2 100 % 10/20/20 2114  Vitals shown include unvalidated device data.  Last Pain:  Vitals:   10/20/20 1835  TempSrc: Oral  PainSc:          Complications: No notable events documented.

## 2020-10-20 NOTE — ED Notes (Signed)
Patient up to restroom at this time

## 2020-10-20 NOTE — Op Note (Signed)
Preoperative diagnosis: acute appendicitis with peritonitis  Postoperative diagnosis: Same   Procedure: laparoscopic appendectomy  Surgeon: Gurney Maxin, M.D.  Asst: none  Anesthesia: Gen.   Indications for procedure: Brittney [name] is a 50 y.o. female with symptoms of pain in right lower quadrant consistent with acute appendicitis. Confirmed by CT.  Description of procedure: The patient was brought into the operative suite, placed supine. Anesthesia was administered with endotracheal tube. The patient's left arm was tucked. All pressure points were offloaded by foam padding. The patient was prepped and draped in the usual sterile fashion.  A transverse incision was made in the left subcostal area. 1 5 mm trocar was placed in the periumbilical area. 1 12 mm trocar was placed in the suprapubic area. A transversus abdominal block was placed on the left and right sides. Next, the patient was placed in trendelenberg, rotated to the left. The omentum was retracted cephalad. The cecum and appendix were identified. The appendix was adhered to the abdominal side wall and terminal ileum. These were bluntly adhesed free. The base of the appendix was dissected and a window through the mesoappendix was created with blunt dissection. A 56mm blue load stapler was used to cut the appendix at its base. Next a 0 PDS endo loop was placed across the mesoappendix and the mesoappendix was cut with cautery.  The appendix was placed in a specimen bag. The pelvis and RLQ were irrigated. No purulence was seen in the pelvis. The appendix was removed via the 12 mm trocar. 0 vicryl was used to close the fascial defect. Pneumoperitoneum was removed, all trocars were removed. All incisions were closed with 4-0 monocryl subcuticular stitch. The patient woke from anesthesia and was brought to PACU in stable condition.  Findings: acute appendicitis without rupture  Specimen: appendix  Blood loss: 30 ml  Local  anesthesia: 30 ml Marcaine  Complications: none  Gurney Maxin, M.D. General, Bariatric, & Minimally Invasive Surgery [facility]

## 2020-10-21 ENCOUNTER — Encounter (HOSPITAL_COMMUNITY): Payer: Self-pay

## 2020-10-21 DIAGNOSIS — K353 Acute appendicitis with localized peritonitis, without perforation or gangrene: Secondary | ICD-10-CM | POA: Diagnosis not present

## 2020-10-21 MED ORDER — IBUPROFEN 800 MG PO TABS: 800.0000 mg | ORAL_TABLET | Freq: Three times a day (TID) | ORAL | 0 refills | Status: DC | PRN

## 2020-10-21 MED ORDER — OXYCODONE HCL 5 MG PO TABS: 5.0000 mg | ORAL_TABLET | Freq: Four times a day (QID) | ORAL | 0 refills | Status: DC | PRN

## 2020-10-21 NOTE — Discharge Instructions (Signed)
#######################################################  GENERAL SURGERY: POST OP INSTRUCTIONS  ######################################################################  EAT Gradually transition to a high fiber diet with a fiber supplement over the next few weeks after discharge.  Start with a pureed / full liquid diet (see below)  WALK Walk an hour a day.  Control your pain to do that.    CONTROL PAIN Control pain so that you can walk, sleep, tolerate sneezing/coughing, go up/down stairs.  HAVE A BOWEL MOVEMENT DAILY Keep your bowels regular to avoid problems.  OK to try a laxative to override constipation.  OK to use an antidairrheal to slow down diarrhea.  Call if not better after 2 tries  CALL IF YOU HAVE PROBLEMS/CONCERNS Call if you are still struggling despite following these instructions. Call if you have concerns not answered by these instructions  ######################################################################    DIET: Follow a light bland diet & liquids the first 24 hours after arrival home, such as soup, liquids, starches, etc.  Be sure to drink plenty of fluids.  Quickly advance to a usual solid diet within a few days.  Avoid fast food or heavy meals as your are more likely to get nauseated or have irregular bowels.  A low-fat, high-fiber diet for the rest of your life is ideal.    Take your usually prescribed home medications unless otherwise directed.  PAIN CONTROL: Pain is best controlled by a usual combination of three different methods TOGETHER: Ice/Heat Over the counter pain medication Prescription pain medication Most patients will experience some swelling and bruising around the incisions.  Ice packs or heating pads (30-60 minutes up to 6 times a day) will help. Use ice for the first few days to help decrease swelling and bruising, then switch to heat to help relax tight/sore spots and speed recovery.  Some people prefer to use ice alone, heat alone,  alternating between ice & heat.  Experiment to what works for you.  Swelling and bruising can take several weeks to resolve.   It is helpful to take an over-the-counter pain medication regularly for the first few weeks.  Choose one of the following that works best for you: Naproxen (Aleve, etc)  Two 220mg tabs twice a day Ibuprofen (Advil, etc) Three 200mg tabs four times a day (every meal & bedtime) Acetaminophen (Tylenol, etc) 500-650mg four times a day (every meal & bedtime) A  prescription for pain medication (such as oxycodone, hydrocodone, etc) should be given to you upon discharge.  Take your pain medication as prescribed.  If you are having problems/concerns with the prescription medicine (does not control pain, nausea, vomiting, rash, itching, etc), please call us [phone] to see if we need to switch you to a different pain medicine that will work better for you and/or control your side effect better. If you need a refill on your pain medication, please contact your pharmacy.  They will contact our office to request authorization. Prescriptions will not be filled after 5 pm or on week-ends.  Avoid getting constipated.  Between the surgery and the pain medications, it is common to experience some constipation.  Increasing fluid intake and taking a fiber supplement (such as Metamucil, Citrucel, FiberCon, MiraLax, etc) 1-2 times a day regularly will usually help prevent this problem from occurring.  A mild laxative (prune juice, Milk of Magnesia, MiraLax, etc) should be taken according to package directions if there are no bowel movements after 48 hours.   Watch out for diarrhea.  If you have many loose bowel movements, simplify your   diet to bland foods & liquids for a few days.  Stop any stool softeners and decrease your fiber supplement.  Switching to mild anti-diarrheal medications (Loperamide/Imodium, Kayopectate, Pepto Bismol) can help.  If this worsens or does not improve, please call  us.  Wash / shower every day.  You may shower over the dressings as they are waterproof.  Continue to shower over incision(s) after the dressing is off. Remove your waterproof bandages 5 days after surgery.  You may leave the incision open to air.  You may have skin tapes (Steri Strips) covering the incision(s).  Leave them on until one week, then remove.  You may replace a dressing/Band-Aid to cover the incision for comfort if you wish.   ACTIVITIES as tolerated:   You may resume regular (light) daily activities beginning the next day--such as daily self-care, walking, climbing stairs--gradually increasing activities as tolerated.  If you can walk 30 minutes without difficulty, it is safe to try more intense activity such as jogging, treadmill, bicycling, low-impact aerobics, swimming, etc. Save the most intensive and strenuous activity for last such as sit-ups, heavy lifting, contact sports, etc  Refrain from any heavy lifting or straining until you are off narcotics for pain control.   DO NOT PUSH THROUGH PAIN.  Let pain be your guide: If it hurts to do something, don't do it.  Pain is your body warning you to avoid that activity for another week until the pain goes down. You may drive when you are no longer taking prescription pain medication, you can comfortably wear a seatbelt, and you can safely maneuver your car and apply brakes. You may have sexual intercourse when it is comfortable.   FOLLOW UP in our office Please call [facility] at [phone] to set up an appointment to see your surgeon in the office for a follow-up appointment approximately 2-3 weeks after your surgery. Make sure that you call for this appointment the day you arrive home to insure a convenient appointment time.  9. IF YOU HAVE DISABILITY OR FAMILY LEAVE FORMS, BRING THEM TO THE OFFICE FOR PROCESSING.  DO NOT GIVE THEM TO YOUR DOCTOR.   WHEN TO CALL US [phone]: Poor pain control Reactions / problems with new  medications (rash/itching, nausea, etc)  Fever over 101.5 F (38.5 C) Worsening swelling or bruising Continued bleeding from incision. Increased pain, redness, or drainage from the incision Difficulty breathing / swallowing   The clinic staff is available to answer your questions during regular business hours (8:30am-5pm).  Please don't hesitate to call and ask to speak to one of our nurses for clinical concerns.   If you have a medical emergency, go to the nearest emergency room or call 911.  A surgeon from [facility] is always on call at the hospitals   [facility] [address], [address], [place], [place]  [address] ? MAIN: [phone] ? TOLL FREE: [phone] ?  FAX [phone] [email or web address]  #######################################################  

## 2020-10-21 NOTE — Plan of Care (Signed)

## 2020-10-21 NOTE — Discharge Summary (Signed)
Physician Discharge Summary  Patient ID: Brittney [name] MRN: [id number] DOB/AGE: 1970-03-12 50 y.o.  Admit date: 10/20/2020 Discharge date: 10/21/2020  Admission Diagnoses:  Discharge Diagnoses:  Active Problems:   Appendicitis   Acute appendicitis   Discharged Condition: good  Hospital Course: Pt did well after laparoscopic appendectomy. She had good pain control, tolerated her diet and ambulated.   Consults: None     Treatments: surgery: laparoscopic appendectomy   Discharge Exam: Blood pressure (!) 104/57, pulse 64, temperature 97.8 F (36.6 C), temperature source Oral, resp. rate 18, height 5\' 7"  (1.702 m), weight 109.7 kg, last menstrual period 07/20/2020, SpO2 97 %. General appearance: alert and cooperative Resp: clear to auscultation bilaterally Cardio: regular rate and rhythm Incision/Wound:CDI port sites  Soft but sore   Disposition: Discharge disposition: 01-Home or Self Care       Discharge Instructions     Diet - low sodium heart healthy   Complete by: As directed    Increase activity slowly   Complete by: As directed       Allergies as of 10/21/2020       Reactions   Peanut-containing Drug Products Hives   Sulfa Antibiotics Nausea And Vomiting        Medication List     TAKE these medications    albuterol 108 (90 Base) MCG/ACT inhaler Commonly known as: VENTOLIN HFA Inhale 2 puffs into the lungs every 4 (four) hours as needed for wheezing or shortness of breath.   ALPRAZolam 0.5 MG tablet Commonly known as: XANAX Take 1 tablet (0.5 mg total) by mouth daily as needed.   ciprofloxacin 500 MG tablet Commonly known as: Cipro Take 1 tablet (500 mg total) by mouth 2 (two) times daily for 7 days.   cyclobenzaprine 10 MG tablet Commonly known as: FLEXERIL Take 1 tablet (10 mg total) by mouth 2 (two) times daily as needed for muscle spasms.   ibuprofen 800 MG tablet Commonly known as: ADVIL Take 1 tablet (800 mg total) by  mouth every 8 (eight) hours as needed.   loratadine 10 MG tablet Commonly known as: CLARITIN Take 10 mg by mouth daily.   multivitamin capsule Take 1 capsule by mouth daily.   oxyCODONE 5 MG immediate release tablet Commonly known as: Oxy IR/ROXICODONE Take 1 tablet (5 mg total) by mouth every 6 (six) hours as needed for severe pain.   phenazopyridine 100 MG tablet Commonly known as: Pyridium Take 1 tablet (100 mg total) by mouth 3 (three) times daily as needed for pain.   QC TUMERIC COMPLEX PO Take by mouth.   valACYclovir 1000 MG tablet Commonly known as: VALTREX TAKE 1 TABLET (1,000 MG TOTAL) BY MOUTH DAILY AS NEEDED.   VITAMIN C PO Take by mouth.   VITAMIN D PO Take by mouth.         Signed: Turner Daniels MD  10/21/2020, 9:39 AM

## 2020-10-21 NOTE — Anesthesia Postprocedure Evaluation (Signed)
Anesthesia Post Note  Patient: Reida Hem  Procedure(s) Performed: APPENDECTOMY LAPAROSCOPIC (Abdomen)     Patient location during evaluation: PACU Anesthesia Type: General Level of consciousness: awake and alert Pain management: pain level controlled Vital Signs Assessment: post-procedure vital signs reviewed and stable Respiratory status: spontaneous breathing, nonlabored ventilation, respiratory function stable and patient connected to nasal cannula oxygen Cardiovascular status: blood pressure returned to baseline and stable Postop Assessment: no apparent nausea or vomiting Anesthetic complications: no   No notable events documented.  Last Vitals:  Vitals:   10/20/20 2200 10/20/20 2229  BP: 114/75 130/79  Pulse: 66 78  Resp: 16   Temp: 36.6 C 36.5 C  SpO2: 100% 100%    Last Pain:  Vitals:   10/20/20 2229  TempSrc: Oral  PainSc:                  March Rummage Rukia Mcgillivray

## 2020-10-23 ENCOUNTER — Ambulatory Visit: Payer: BC Managed Care – PPO | Admitting: Family Medicine

## 2020-10-24 NOTE — Telephone Encounter (Signed)
Patient went to the ED

## 2020-10-26 ENCOUNTER — Other Ambulatory Visit: Payer: Self-pay | Admitting: Internal Medicine

## 2020-10-26 DIAGNOSIS — E782 Mixed hyperlipidemia: Secondary | ICD-10-CM

## 2020-10-26 LAB — SURGICAL PATHOLOGY

## 2020-10-26 MED ORDER — ATORVASTATIN CALCIUM 10 MG PO TABS
10.0000 mg | ORAL_TABLET | Freq: Every day | ORAL | 1 refills | Status: DC
Start: 2020-10-26 — End: 2021-03-07

## 2020-11-02 NOTE — Addendum Note (Signed)
Addended by: Nilda Riggs on: 11/02/2020 02:19 PM   Modules accepted: Orders

## 2020-11-11 ENCOUNTER — Other Ambulatory Visit: Payer: Self-pay | Admitting: Internal Medicine

## 2020-11-11 ENCOUNTER — Other Ambulatory Visit: Payer: Self-pay | Admitting: Family Medicine

## 2020-11-11 DIAGNOSIS — G4700 Insomnia, unspecified: Secondary | ICD-10-CM

## 2020-11-14 MED ORDER — ALPRAZOLAM 0.5 MG PO TABS: 0.5000 mg | ORAL_TABLET | Freq: Every day | ORAL | 0 refills | Status: DC | PRN

## 2020-11-14 MED ORDER — ALBUTEROL SULFATE HFA 108 (90 BASE) MCG/ACT IN AERS: 2.0000 | INHALATION_SPRAY | RESPIRATORY_TRACT | 0 refills | Status: DC | PRN

## 2020-11-23 ENCOUNTER — Telehealth: Payer: Self-pay | Admitting: Internal Medicine

## 2020-11-23 NOTE — Telephone Encounter (Signed)
Inbound call from [facility] with [facility] stating she faxed over a path report for patient for patient and is wanting to know if it was received.  Please advise.

## 2020-11-24 NOTE — Telephone Encounter (Signed)
Lm to let Brittney [name] know I did receive the path she faxed

## 2020-11-25 ENCOUNTER — Encounter (HOSPITAL_BASED_OUTPATIENT_CLINIC_OR_DEPARTMENT_OTHER): Payer: Self-pay

## 2020-11-25 ENCOUNTER — Other Ambulatory Visit: Payer: Self-pay

## 2020-11-25 DIAGNOSIS — Z91010 Allergy to peanuts: Secondary | ICD-10-CM | POA: Insufficient documentation

## 2020-11-25 DIAGNOSIS — M79661 Pain in right lower leg: Secondary | ICD-10-CM | POA: Insufficient documentation

## 2020-11-25 DIAGNOSIS — Z8616 Personal history of COVID-19: Secondary | ICD-10-CM | POA: Diagnosis not present

## 2020-11-25 DIAGNOSIS — J45909 Unspecified asthma, uncomplicated: Secondary | ICD-10-CM | POA: Diagnosis not present

## 2020-11-25 NOTE — ED Triage Notes (Addendum)
Pt is present for burning of the right posterior thigh and behind the knee that started three days ago. Pt has been woken up out of of her sleep due to the pain and burning sensation. Pt recently had an appendetomy 10/20/20 and finished abx one week ago. Denies redness and swelling. Took two 325 mg of ASA PTA.

## 2020-11-26 ENCOUNTER — Emergency Department (HOSPITAL_BASED_OUTPATIENT_CLINIC_OR_DEPARTMENT_OTHER): Payer: BC Managed Care – PPO

## 2020-11-26 ENCOUNTER — Emergency Department (HOSPITAL_BASED_OUTPATIENT_CLINIC_OR_DEPARTMENT_OTHER)
Admission: EM | Admit: 2020-11-26 | Discharge: 2020-11-26 | Disposition: A | Discharge status: Home / Self Care | Payer: BC Managed Care – PPO | Attending: Emergency Medicine | Admitting: Emergency Medicine

## 2020-11-26 DIAGNOSIS — M79661 Pain in right lower leg: Secondary | ICD-10-CM | POA: Diagnosis not present

## 2020-11-26 DIAGNOSIS — M79604 Pain in right leg: Secondary | ICD-10-CM

## 2020-11-26 LAB — BASIC METABOLIC PANEL
Anion gap: 7 (ref 5–15)
BUN: 16 mg/dL (ref 6–20)
CO2: 28 mmol/L (ref 22–32)
Calcium: 10 mg/dL (ref 8.9–10.3)
Chloride: 103 mmol/L (ref 98–111)
Creatinine, Ser: 0.77 mg/dL (ref 0.44–1.00)
GFR, Estimated: >60 mL/min (ref >60)
Glucose, Bld: 98 mg/dL (ref 70–99)
Potassium: 4.2 mmol/L (ref 3.5–5.1)
Sodium: 138 mmol/L (ref 135–145)

## 2020-11-26 NOTE — Discharge Instructions (Signed)
You were seen today for leg pain.  Your metabolic panel and ultrasound are reassuring.  Follow-up with your primary physician if not improving in 2 to 3 days.  Trial 600 mg of ibuprofen every 6 hours for the next 2 to 3 days to see if this helps at all.

## 2020-11-26 NOTE — ED Provider Notes (Signed)
**Brittney [name] De-Identified via Obfuscation** Brittney Brittney [name]   CSN: [id number] Arrival date & time: 11/25/20  2146     History Chief Complaint  Patient presents with   Leg Pain    Brittney Brittney [name] is a 50 y.o. female.  HPI     This 50 year old female with a history of asthma who presents with right leg pain.  Patient reports that she has had right leg pain that has woken her from sleep last several nights.  She describes the pain as burning and in the posterior aspect of the leg behind the knee and radiating upward to the thigh.  She has not noted any swelling or redness.  She did Brittney [name] what she thought were varicose veins.  She took 2 aspirin prior to arrival.  Of Brittney [name], patient did have an appendectomy 1 month ago.  She states that she had a complicated postoperative course and required 2 rounds of antibiotics which she finished last Saturday.  She is not had any fevers.  Denies any weakness in the leg.  Past Medical History:  Diagnosis Date   Anemia    Anxiety    Asthma    Insomnia    Obesity (BMI 35.0-39.9 without comorbidity)    Oral herpes     Patient Active Problem List   Diagnosis Date Noted   Appendicitis 10/20/2020   Acute appendicitis 10/20/2020   COVID-19 virus infection 05/10/2020   Hyperlipidemia 02/24/2020   Obesity (BMI 35.0-39.9 without comorbidity)    Insomnia    Oral herpes    Anemia    Asthma     Past Surgical History:  Procedure Laterality Date   CHOLECYSTECTOMY     LAPAROSCOPIC APPENDECTOMY N/A 10/20/2020   Procedure: APPENDECTOMY LAPAROSCOPIC;  Surgeon: Kinsinger, Arta Bruce, MD;  Location: [facility];  Service: General;  Laterality: N/A;   lapband     TONSILLECTOMY     tummy tuck       OB History   No obstetric history on file.     Family History  Problem Relation Age of Onset   Healthy Mother    Cancer Father    Pancreatic cancer Father    Breast cancer Maternal Grandmother    Colon cancer Neg Hx    Colon polyps Neg Hx    Esophageal  cancer Neg Hx    Rectal cancer Neg Hx    Stomach cancer Neg Hx     Social History   Tobacco Use   Smoking status: Never   Smokeless tobacco: Never  Vaping Use   Vaping Use: Never used  Substance Use Topics   Alcohol use: Not Currently   Drug use: Never    Home Medications Prior to Admission medications   Medication Sig Start Date End Date Taking? Authorizing Provider  albuterol (VENTOLIN HFA) 108 (90 Base) MCG/ACT inhaler Inhale 2 puffs into the lungs every 4 (four) hours as needed for wheezing or shortness of breath. 11/14/20   Isaac Bliss, Rayford Halsted, MD  ALPRAZolam Duanne Moron) 0.5 MG tablet Take 1 tablet (0.5 mg total) by mouth daily as needed. 11/14/20   Isaac Bliss, Rayford Halsted, MD  Ascorbic Acid (VITAMIN C PO) Take by mouth.    [provider]  atorvastatin (LIPITOR) 10 MG tablet Take 1 tablet (10 mg total) by mouth daily. 10/26/20   Isaac Bliss, Rayford Halsted, MD  cyclobenzaprine (FLEXERIL) 10 MG tablet Take 1 tablet (10 mg total) by mouth 2 (two) times daily as needed for muscle spasms. 09/21/19  Wardell Honour, MD  ibuprofen (ADVIL) 800 MG tablet Take 1 tablet (800 mg total) by mouth every 8 (eight) hours as needed. 10/21/20   Cornett, Marcello Moores, MD  loratadine (CLARITIN) 10 MG tablet Take 10 mg by mouth daily.    [provider]  Multiple Vitamin (MULTIVITAMIN) capsule Take 1 capsule by mouth daily.    [provider]  oxyCODONE (OXY IR/ROXICODONE) 5 MG immediate release tablet Take 1 tablet (5 mg total) by mouth every 6 (six) hours as needed for severe pain. 10/21/20   Cornett, Marcello Moores, MD  phenazopyridine (PYRIDIUM) 100 MG tablet Take 1 tablet (100 mg total) by mouth 3 (three) times daily as needed for pain. 10/16/20   [name], PA-C  Turmeric (QC TUMERIC COMPLEX PO) Take by mouth.    [provider]  valACYclovir (VALTREX) 1000 MG tablet TAKE 1 TABLET (1,000 MG TOTAL) BY MOUTH DAILY AS NEEDED. 12/14/19   Isaac Bliss, Rayford Halsted, MD  VITAMIN D PO Take by mouth.    [provider]    Allergies    Peanut-containing drug products and Sulfa antibiotics  Review of Systems   Review of Systems  Constitutional:  Negative for fever.  Respiratory:  Negative for shortness of breath.   Cardiovascular:  Negative for chest pain and leg swelling.  Musculoskeletal:        Leg pain  Neurological:  Negative for weakness.  All other systems reviewed and are negative.  Physical Exam Updated Vital Signs BP (!) 106/57   Pulse 68   Temp 98 F (36.7 C) (Oral)   Resp 18   Ht 1.702 m (5\' 7" )   Wt 113.4 kg   LMP 11/02/2020   SpO2 98%   BMI 39.16 kg/m   Physical Exam Vitals and nursing Brittney [name] reviewed.  Constitutional:      Appearance: She is well-developed. She is obese. She is not ill-appearing.  HENT:     Head: Normocephalic and atraumatic.     Nose: Nose normal.     Mouth/Throat:     Mouth: Mucous membranes are moist.  Eyes:     Pupils: Pupils are equal, round, and reactive to light.  Cardiovascular:     Rate and Rhythm: Normal rate and regular rhythm.     Heart sounds: Normal heart sounds.  Pulmonary:     Effort: Pulmonary effort is normal. No respiratory distress.     Breath sounds: No wheezing.  Abdominal:     Palpations: Abdomen is soft.  Musculoskeletal:        General: Tenderness present.     Cervical back: Neck supple.     Comments: Right calf tenderness palpation, no asymmetric swelling noted  Skin:    General: Skin is warm and dry.  Neurological:     Mental Status: She is alert and oriented to person, place, and time.  Psychiatric:        Mood and Affect: Mood normal.    ED Results / Procedures / Treatments   Labs (all labs ordered are listed, but only abnormal results are displayed) Labs Reviewed  BASIC METABOLIC PANEL    EKG None  Radiology US Venous Img Lower Unilateral Right  Result Date: 11/26/2020 CLINICAL DATA:  Calf pain for 2 days EXAM: RIGHT LOWER EXTREMITY  VENOUS DOPPLER ULTRASOUND TECHNIQUE: Gray-scale sonography with graded compression, as well as color Doppler and duplex ultrasound were performed to evaluate the lower extremity deep venous systems from the level of the common femoral vein  and including the common femoral, femoral, profunda femoral, popliteal and calf veins including the posterior tibial, peroneal and gastrocnemius veins when visible. The superficial great saphenous vein was also interrogated. Spectral Doppler was utilized to evaluate flow at rest and with distal augmentation maneuvers in the common femoral, femoral and popliteal veins. COMPARISON:  None. FINDINGS: Contralateral Common Femoral Vein: Respiratory phasicity is normal and symmetric with the symptomatic side. No evidence of thrombus. Normal compressibility. Common Femoral Vein: No evidence of thrombus. Normal compressibility, respiratory phasicity and response to augmentation. Saphenofemoral Junction: No evidence of thrombus. Normal compressibility and flow on color Doppler imaging. Profunda Femoral Vein: No evidence of thrombus. Normal compressibility and flow on color Doppler imaging. Femoral Vein: No evidence of thrombus. Normal compressibility, respiratory phasicity and response to augmentation. Popliteal Vein: No evidence of thrombus. Normal compressibility, respiratory phasicity and response to augmentation. Calf Veins: No evidence of thrombus. Normal compressibility and flow on color Doppler imaging. Superficial Great Saphenous Vein: No evidence of thrombus. Normal compressibility. Venous Reflux:  None. Other Findings:  None. IMPRESSION: No evidence of deep venous thrombosis. Electronically Signed   By: Inez Catalina M.D.   On: 11/26/2020 01:18    Procedures Procedures   Medications Ordered in ED Medications - No data to display  ED Course  I have reviewed the triage vital signs and the nursing notes.  Pertinent labs & imaging results that were available during my care  of the patient were reviewed by me and considered in my medical decision making (see chart for details).    MDM Rules/Calculators/A&P                           Patient presents with right lower extremity pain.  She is nontoxic and vital signs are reassuring.  She has tenderness palpation of the right calf.  Denies injury.  Considerations include but not limited to DVT, soft tissue injury, venous stasis or varicosities.  No indication of overlying skin changes to suggest cellulitis.  Another consideration would be metabolic derangement such as hypokalemia although this was likely result in more diffuse myalgia.  BMP obtained.  Potassium is normal.  Right lower extremity Doppler is negative for DVT.  Recommend trial of anti-inflammatories.  Patient states understanding.  After history, exam, and medical workup I feel the patient has been appropriately medically screened and is safe for discharge home. Pertinent diagnoses were discussed with the patient. Patient was given return precautions.  Final Clinical Impression(s) / ED Diagnoses Final diagnoses:  Pain of right lower extremity    Rx / DC Orders ED Discharge Orders     None        Phiona Ramnauth, Barbette Hair, MD 11/26/20 [phone]

## 2020-12-13 ENCOUNTER — Encounter: Payer: Self-pay | Admitting: Internal Medicine

## 2020-12-19 ENCOUNTER — Telehealth: Payer: BC Managed Care – PPO | Admitting: Physician Assistant

## 2020-12-19 DIAGNOSIS — J069 Acute upper respiratory infection, unspecified: Secondary | ICD-10-CM | POA: Diagnosis not present

## 2020-12-19 MED ORDER — BENZONATATE 100 MG PO CAPS: 100.0000 mg | ORAL_CAPSULE | Freq: Three times a day (TID) | ORAL | 0 refills | Status: DC | PRN

## 2020-12-19 MED ORDER — FLUTICASONE PROPIONATE 50 MCG/ACT NA SUSP: 2.0000 | Freq: Every day | NASAL | 0 refills | Status: AC

## 2020-12-19 NOTE — Progress Notes (Signed)
I have spent 5 minutes in review of e-visit questionnaire, review and updating patient chart, medical decision making and response to patient.   Aliyanna Wassmer Cody Cyler Kappes, PA-C    

## 2020-12-19 NOTE — Progress Notes (Signed)
E-Visit for Upper Respiratory Infection   We are sorry you are not feeling well.  Here is how we plan to help!  Based on what you have shared with me, it looks like you may have a viral upper respiratory infection.  Upper respiratory infections are caused by a large number of viruses; however, rhinovirus is the most common cause.  I do not think it is unreasonable for you to take a home COVID test, just to be cautious. If positive, please let us know. Otherwise, continue recommendations listed below.   Symptoms vary from person to person, with common symptoms including sore throat, cough, fatigue or lack of energy and feeling of general discomfort.  A low-grade fever of up to 100.4 may present, but is often uncommon.  Symptoms vary however, and are closely related to a person's age or underlying illnesses.  The most common symptoms associated with an upper respiratory infection are nasal discharge or congestion, cough, sneezing, headache and pressure in the ears and face.  These symptoms usually persist for about 3 to 10 days, but can last up to 2 weeks.  It is important to know that upper respiratory infections do not cause serious illness or complications in most cases.    Upper respiratory infections can be transmitted from person to person, with the most common method of transmission being a person's hands.  The virus is able to live on the skin and can infect other persons for up to 2 hours after direct contact.  Also, these can be transmitted when someone coughs or sneezes; thus, it is important to cover the mouth to reduce this risk.  To keep the spread of the illness at [place], good hand hygiene is very important.  This is an infection that is most likely caused by a virus. There are no specific treatments other than to help you with the symptoms until the infection runs its course.  We are sorry you are not feeling well.  Here is how we plan to help!   For nasal congestion, you may use an oral  decongestants such as Mucinex D or if you have glaucoma or high blood pressure use plain Mucinex.  Saline nasal spray or nasal drops can help and can safely be used as often as needed for congestion.  For your congestion, I have prescribed Fluticasone nasal spray one spray in each nostril twice a day  If you do not have a history of heart disease, hypertension, diabetes or thyroid disease, prostate/bladder issues or glaucoma, you may also use Sudafed to treat nasal congestion.  It is highly recommended that you consult with a pharmacist or your primary care physician to ensure this medication is safe for you to take.     If you have a cough, you may use cough suppressants such as Delsym and Robitussin.  If you have glaucoma or high blood pressure, you can also use Coricidin HBP.   For cough I have prescribed for you A prescription cough medication called Tessalon Perles 100 mg. You may take 1-2 capsules every 8 hours as needed for cough  If you have a sore or scratchy throat, use a saltwater gargle-  to  teaspoon of salt dissolved in a 4-ounce to 8-ounce glass of warm water.  Gargle the solution for approximately 15-30 seconds and then spit.  It is important not to swallow the solution.  You can also use throat lozenges/cough drops and Chloraseptic spray to help with throat pain or discomfort.  Warm  or cold liquids can also be helpful in relieving throat pain.  For headache, pain or general discomfort, you can use Ibuprofen or Tylenol as directed.   Some authorities believe that zinc sprays or the use of Echinacea may shorten the course of your symptoms.   HOME CARE Only take medications as instructed by your medical team. Be sure to drink plenty of fluids. Water is fine as well as fruit juices, sodas and electrolyte beverages. You may want to stay away from caffeine or alcohol. If you are nauseated, try taking small sips of liquids. How do you know if you are getting enough fluid? Your urine should  be a pale yellow or almost colorless. Get rest. Taking a steamy shower or using a humidifier may help nasal congestion and ease sore throat pain. You can place a towel over your head and breathe in the steam from hot water coming from a faucet. Using a saline nasal spray works much the same way. Cough drops, hard candies and sore throat lozenges may ease your cough. Avoid close contacts especially the very young and the elderly Cover your mouth if you cough or sneeze Always remember to wash your hands.   GET HELP RIGHT AWAY IF: You develop worsening fever. If your symptoms do not improve within 10 days You develop yellow or green discharge from your nose over 3 days. You have coughing fits You develop a severe head ache or visual changes. You develop shortness of breath, difficulty breathing or start having chest pain Your symptoms persist after you have completed your treatment plan  MAKE SURE YOU  Understand these instructions. Will watch your condition. Will get help right away if you are not doing well or get worse.  Thank you for choosing an e-visit.  Your e-visit answers were reviewed by a board certified advanced clinical practitioner to complete your personal care plan. Depending upon the condition, your plan could have included both over the counter or prescription medications.  Please review your pharmacy choice. Make sure the pharmacy is open so you can pick up prescription now. If there is a problem, you may contact your provider through [employer] and have the prescription routed to another pharmacy.  Your safety is important to [place]. If you have drug allergies check your prescription carefully.   For the next 24 hours you can use MyChart to ask questions about today's visit, request a non-urgent call back, or ask for a work or school excuse. You will get an email in the next two days asking about your experience. I hope that your e-visit has been valuable and will  speed your recovery.

## 2021-01-22 ENCOUNTER — Encounter: Payer: Self-pay | Admitting: Internal Medicine

## 2021-02-13 ENCOUNTER — Telehealth: Payer: Self-pay | Admitting: *Deleted

## 2021-02-13 ENCOUNTER — Ambulatory Visit (AMBULATORY_SURGERY_CENTER): Payer: BC Managed Care – PPO | Admitting: *Deleted

## 2021-02-13 ENCOUNTER — Other Ambulatory Visit: Payer: Self-pay

## 2021-02-13 VITALS — Ht 67.0 in | Wt 220.0 lb

## 2021-02-13 DIAGNOSIS — Z86010 Personal history of colonic polyps: Secondary | ICD-10-CM

## 2021-02-13 MED ORDER — PLENVU 140 G PO SOLR
1.0000 | Freq: Once | ORAL | 0 refills | Status: AC
Start: 2021-02-13 — End: 2021-02-13

## 2021-02-13 NOTE — Progress Notes (Signed)
No egg or soy allergy known to patient  No issues known to pt with past sedation with any surgeries or procedures Patient denies ever being told they had issues or difficulty with intubation  No FH of Malignant Hyperthermia Pt is not on diet pills Pt is not on  home 02  Pt is not on blood thinners  Pt denies issues with constipation  No A fib or A flutter  Pt is vaccinated  for Covid   PLENVU Coupon to pt in PV today , Code to Pharmacy and  NO PA's for preps discussed with pt In PV today   Due to the COVID-19 pandemic we are asking patients to follow certain guidelines in PV and the [place]   Pt aware of COVID protocols and [facility] guidelines   PV completed over the phone. Pt verified name, DOB, address and insurance during PV today.  Pt mailed instruction packet with copy of consent form to read and not return, and instructions.  Pt encouraged to call with questions or issues.  If pt has My chart, procedure instructions sent via My Chart

## 2021-02-13 NOTE — Telephone Encounter (Signed)
FYI-Pt. Had pre-visit today and she wanted me to make you aware that her appendix was removed 10/20/20 and wanted you to look over the biopsy results.

## 2021-02-14 NOTE — Telephone Encounter (Signed)
Noted  

## 2021-02-26 ENCOUNTER — Encounter: Payer: Self-pay | Admitting: Internal Medicine

## 2021-02-27 ENCOUNTER — Telehealth: Payer: Self-pay | Admitting: Gastroenterology

## 2021-02-27 NOTE — Telephone Encounter (Signed)
Patient contacted the call center this evening because of severe nausea following completion of the first half of the Plenvu.  She has not vomited.  She used refrigerated Gatorade for the mixture.  She has undergone bowel preps before without significant nausea.  I recommended she take some Zofran with the next half of her prep. Prescription called into her preferred pharmacy for Zofran 4 mg, 1 tb po every 4 hours as needed for nausea #4 rf0

## 2021-02-28 ENCOUNTER — Ambulatory Visit (AMBULATORY_SURGERY_CENTER): Payer: BC Managed Care – PPO | Admitting: Internal Medicine

## 2021-02-28 ENCOUNTER — Encounter: Payer: Self-pay | Admitting: Internal Medicine

## 2021-02-28 VITALS — BP 107/67 | HR 63 | Temp 96.8°F | Resp 15 | Ht 67.0 in | Wt 220.0 lb

## 2021-02-28 DIAGNOSIS — Z86010 Personal history of colonic polyps: Secondary | ICD-10-CM

## 2021-02-28 MED ORDER — SODIUM CHLORIDE 0.9 % IV SOLN: 500.0000 mL | Freq: Once | INTRAVENOUS | Status: DC

## 2021-02-28 NOTE — Progress Notes (Signed)
HISTORY OF PRESENT ILLNESS:  Brittney [name] is a 51 y.o. female who presents today for surveillance colonoscopy.  Last colonoscopy February 2022.  Large sessile lesion in the right colon removed piecemeal.  Marking tattoo placed.  Now for surveillance.  No active complaints  REVIEW OF SYSTEMS:  All non-GI ROS negative.  Past Medical History:  Diagnosis Date   Allergy    seasonal   Anemia    Anxiety    Asthma    Insomnia    Obesity (BMI 35.0-39.9 without comorbidity)    Oral herpes     Past Surgical History:  Procedure Laterality Date   APPENDECTOMY     BILATERAL SALPINGOOPHORECTOMY     ovaries left   CHOLECYSTECTOMY     COLONOSCOPY     LAPAROSCOPIC APPENDECTOMY N/A 10/20/2020   Procedure: APPENDECTOMY LAPAROSCOPIC;  Surgeon: Kinsinger, Arta Bruce, MD;  Location: [facility];  Service: General;  Laterality: N/A;   lapband     TONSILLECTOMY     tummy tuck      Social History Brittney [name]  reports that she has never smoked. She has never used smokeless tobacco. She reports that she does not currently use alcohol. She reports that she does not use drugs.  family history includes Breast cancer in her maternal grandmother; Cancer in her father; Healthy in her mother; Pancreatic cancer in her father.  Allergies  Allergen Reactions   Other     All nuts   Peanut-Containing Drug Products Hives   Sulfa Antibiotics Nausea And Vomiting       PHYSICAL EXAMINATION:  Vital signs: BP 97/61    Pulse 71    Temp (!) 96.8 F (36 C) (Skin)    Ht 5\' 7"  (1.702 m)    Wt 220 lb (99.8 kg)    LMP  (Approximate) Comment: around Thanksgiving 2022   SpO2 100%    BMI 34.46 kg/m  General: Well-developed, well-nourished, no acute distress HEENT: Sclerae are anicteric, conjunctiva pink. Oral mucosa intact Lungs: Clear Heart: Regular Abdomen: soft, nontender, nondistended, no obvious ascites, no peritoneal signs, normal bowel sounds. No organomegaly. Extremities: No edema Psychiatric:  alert and oriented x3. Cooperative     ASSESSMENT:  1.  History of adenomatous polyps on index colonoscopy last year.  Advanced lesion in right colon removed piecemeal as described.  Now for surveillance   PLAN:  1.  Surveillance colonoscopy

## 2021-02-28 NOTE — Op Note (Signed)
[facility] Patient Name: [name] Procedure Date: 02/28/2021 2:33 PM MRN: [id number] Endoscopist: Docia Chuck. Henrene Pastor , MD Age: 51 Referring MD:  Date of Birth: [birth year] Gender: Female Account #: [id number] Procedure:                Colonoscopy Indications:              High risk colon cancer surveillance: Personal                            history of adenoma (10 mm or greater in size).                            Index examination February 2022. 20 mm lateral                            spreading tumor (adenoma) and right colon removed                            piecemeal. Tattooed. Now for surveillance Medicines:                Monitored Anesthesia Care Procedure:                Pre-Anesthesia Assessment:                           - Prior to the procedure, a History and Physical                            was performed, and patient medications and                            allergies were reviewed. The patient's tolerance of                            previous anesthesia was also reviewed. The risks                            and benefits of the procedure and the sedation                            options and risks were discussed with the patient.                            All questions were answered, and informed consent                            was obtained. Prior Anticoagulants: The patient has                            taken no previous anticoagulant or antiplatelet                            agents. ASA Grade Assessment: II - A patient with  mild systemic disease. After reviewing the risks                            and benefits, the patient was deemed in                            satisfactory condition to undergo the procedure.                           After obtaining informed consent, the colonoscope                            was passed under direct vision. Throughout the                            procedure, the patient's blood  pressure, pulse, and                            oxygen saturations were monitored continuously. The                            CF [id number] #[id number] was introduced through the anus                            and advanced to the the cecum, identified by                            appendiceal orifice and ileocecal valve. The                            ileocecal valve, appendiceal orifice, and rectum                            were photographed. The quality of the bowel                            preparation was excellent. The colonoscopy was                            performed without difficulty. The patient tolerated                            the procedure well. The bowel preparation used was                            Plenvu via split dose instruction. Scope In: 2:37:56 PM Scope Out: 2:50:29 PM Scope Withdrawal Time: 0 hours 9 minutes 51 seconds  Total Procedure Duration: 0 hours 12 minutes 33 seconds  Findings:                 Diverticula were found in the left colon. The                            previous polypectomy site in the ascending colon  was easily identified by the marking tattoo. No                            evidence of residual neoplasia                           The exam was otherwise without abnormality on                            direct and retroflexion views. Complications:            No immediate complications. Estimated blood loss:                            None. Estimated Blood Loss:     Estimated blood loss: none. Impression:               - Diverticulosis in the left colon. No evidence of                            residual neoplasia from area of resection and right                            colon.                           - The examination was otherwise normal on direct                            and retroflexion views.                           - No specimens collected. Recommendation:           - Repeat colonoscopy in 3 years for  surveillance                            (large lateral spreading tumor 2022).                           - Patient has a contact number available for                            emergencies. The signs and symptoms of potential                            delayed complications were discussed with the                            patient. Return to normal activities tomorrow.                            Written discharge instructions were provided to the                            patient.                           -  Resume previous diet.                           - Continue present medications. Docia Chuck. Henrene Pastor, MD 02/28/2021 2:55:53 PM This report has been signed electronically.

## 2021-02-28 NOTE — Progress Notes (Signed)
PT taken to PACU. Monitors in place. VSS. Report given to RN. 

## 2021-02-28 NOTE — Patient Instructions (Addendum)
Handout was given to your care partner on diverticulosis. Resume your medications today. Repeat surveillance colonoscopy in 3 years. Please call if any questions or concerns.     YOU HAD AN ENDOSCOPIC PROCEDURE TODAY AT [facility]:   Refer to the procedure report that was given to you for any specific questions about what was found during the examination.  If the procedure report does not answer your questions, please call your gastroenterologist to clarify.  If you requested that your care partner not be given the details of your procedure findings, then the procedure report has been included in a sealed envelope for you to review at your convenience later.  YOU SHOULD EXPECT: Some feelings of bloating in the abdomen. Passage of more gas than usual.  Walking can help get rid of the air that was put into your GI tract during the procedure and reduce the bloating. If you had a lower endoscopy (such as a colonoscopy or flexible sigmoidoscopy) you may notice spotting of blood in your stool or on the toilet paper. If you underwent a bowel prep for your procedure, you may not have a normal bowel movement for a few days.  Please Note:  You might notice some irritation and congestion in your nose or some drainage.  This is from the oxygen used during your procedure.  There is no need for concern and it should clear up in a day or so.  SYMPTOMS TO REPORT IMMEDIATELY:  Following lower endoscopy (colonoscopy or flexible sigmoidoscopy):  Excessive amounts of blood in the stool  Significant tenderness or worsening of abdominal pains  Swelling of the abdomen that is new, acute  Fever of 100F or higher   For urgent or emergent issues, a gastroenterologist can be reached at any hour by calling [phone]. Do not use MyChart messaging for urgent concerns.    DIET:  We do recommend a small meal at first, but then you may proceed to your regular diet.  Drink plenty of fluids but you  should avoid alcoholic beverages for 24 hours.  ACTIVITY:  You should plan to take it easy for the rest of today and you should NOT DRIVE or use heavy machinery until tomorrow (because of the sedation medicines used during the test).    FOLLOW UP: Our staff will call the number listed on your records 48-72 hours following your procedure to check on you and address any questions or concerns that you may have regarding the information given to you following your procedure. If we do not reach you, we will leave a message.  We will attempt to reach you two times.  During this call, we will ask if you have developed any symptoms of COVID 19. If you develop any symptoms (ie: fever, flu-like symptoms, shortness of breath, cough etc.) before then, please call [phone].  If you test positive for Covid 19 in the 2 weeks post procedure, please call and report this information to [place].    If any biopsies were taken you will be contacted by phone or by letter within the next 1-3 weeks.  Please call us at [phone] if you have not heard about the biopsies in 3 weeks.    SIGNATURES/CONFIDENTIALITY: You and/or your care partner have signed paperwork which will be entered into your electronic medical record.  These signatures attest to the fact that that the information above on your After Visit Summary has been reviewed and is understood.  Full responsibility of the  confidentiality of this discharge information lies with you and/or your care-partner.

## 2021-02-28 NOTE — Progress Notes (Signed)
VS- Nash Mantis  Pt took Zofran 02-27-21 to help with severe nausea after taking first dose of Plenvu  Pt's states no medical or surgical changes since previsit or office visit.

## 2021-02-28 NOTE — Progress Notes (Signed)
No problems noted in the recovery room. maw 

## 2021-02-28 NOTE — Telephone Encounter (Signed)
Thanks Scott!! 

## 2021-03-02 ENCOUNTER — Telehealth: Payer: Self-pay

## 2021-03-02 ENCOUNTER — Encounter: Payer: BC Managed Care – PPO | Admitting: Internal Medicine

## 2021-03-02 NOTE — Telephone Encounter (Signed)
°  Follow up Call-  Call back number 02/28/2021 02/29/2020  Post procedure Call Back phone  # [phone]  Permission to leave phone message Yes Yes     Patient questions:  Do you have a fever, pain , or abdominal swelling? No. Pain Score  0 *  Have you tolerated food without any problems? Yes.    Have you been able to return to your normal activities? Yes.    Do you have any questions about your discharge instructions: Diet   No. Medications  No. Follow up visit  No.  Do you have questions or concerns about your Care? No.  Actions: * If pain score is 4 or above: No action needed, pain <4.

## 2021-03-07 ENCOUNTER — Encounter: Payer: Self-pay | Admitting: Internal Medicine

## 2021-03-07 ENCOUNTER — Ambulatory Visit (INDEPENDENT_AMBULATORY_CARE_PROVIDER_SITE_OTHER): Payer: BC Managed Care – PPO | Admitting: Internal Medicine

## 2021-03-07 VITALS — BP 124/84 | HR 70 | Temp 98.0°F | Ht 67.0 in | Wt 232.9 lb

## 2021-03-07 DIAGNOSIS — Z0000 Encounter for general adult medical examination without abnormal findings: Secondary | ICD-10-CM | POA: Diagnosis not present

## 2021-03-07 DIAGNOSIS — E669 Obesity, unspecified: Secondary | ICD-10-CM | POA: Diagnosis not present

## 2021-03-07 DIAGNOSIS — G4700 Insomnia, unspecified: Secondary | ICD-10-CM | POA: Diagnosis not present

## 2021-03-07 DIAGNOSIS — E785 Hyperlipidemia, unspecified: Secondary | ICD-10-CM

## 2021-03-07 LAB — CBC WITH DIFFERENTIAL/PLATELET
Basophils Absolute: 0 10*3/uL (ref 0.0–0.1)
Basophils Relative: 0.6 % (ref 0.0–3.0)
Eosinophils Absolute: 0.3 10*3/uL (ref 0.0–0.7)
Eosinophils Relative: 4.7 % (ref 0.0–5.0)
HCT: 41.6 % (ref 36.0–46.0)
Hemoglobin: 13.8 g/dL (ref 12.0–15.0)
Lymphocytes Relative: 32.4 % (ref 12.0–46.0)
Lymphs Abs: 2.1 10*3/uL (ref 0.7–4.0)
MCHC: 33.1 g/dL (ref 30.0–36.0)
MCV: 90.4 fl (ref 78.0–100.0)
Monocytes Absolute: 0.6 10*3/uL (ref 0.1–1.0)
Monocytes Relative: 9.7 % (ref 3.0–12.0)
Neutro Abs: 3.5 10*3/uL (ref 1.4–7.7)
Neutrophils Relative %: 52.6 % (ref 43.0–77.0)
Platelets: 265 10*3/uL (ref 150.0–400.0)
RBC: 4.6 Mil/uL (ref 3.87–5.11)
RDW: 13.9 % (ref 11.5–15.5)
WBC: 6.6 10*3/uL (ref 4.0–10.5)

## 2021-03-07 LAB — LIPID PANEL
Cholesterol: 244 mg/dL — ABNORMAL HIGH (ref 0–200)
HDL: 54.3 mg/dL (ref >39.00)
LDL Cholesterol: 161 mg/dL — ABNORMAL HIGH (ref 0–99)
NonHDL: 189.36
Total CHOL/HDL Ratio: 4
Triglycerides: 144 mg/dL (ref 0.0–149.0)
VLDL: 28.8 mg/dL (ref 0.0–40.0)

## 2021-03-07 LAB — COMPREHENSIVE METABOLIC PANEL
ALT: 15 U/L (ref 0–35)
AST: 17 U/L (ref 0–37)
Albumin: 4.8 g/dL (ref 3.5–5.2)
Alkaline Phosphatase: 64 U/L (ref 39–117)
BUN: 19 mg/dL (ref 6–23)
CO2: 29 mEq/L (ref 19–32)
Calcium: 10 mg/dL (ref 8.4–10.5)
Chloride: 100 mEq/L (ref 96–112)
Creatinine, Ser: 0.84 mg/dL (ref 0.40–1.20)
GFR: 80.47 mL/min (ref >60.00)
Glucose, Bld: 94 mg/dL (ref 70–99)
Potassium: 3.8 mEq/L (ref 3.5–5.1)
Sodium: 137 mEq/L (ref 135–145)
Total Bilirubin: 0.9 mg/dL (ref 0.2–1.2)
Total Protein: 8.1 g/dL (ref 6.0–8.3)

## 2021-03-07 LAB — VITAMIN D 25 HYDROXY (VIT D DEFICIENCY, FRACTURES): VITD: 74.62 ng/mL (ref 30.00–100.00)

## 2021-03-07 LAB — HEMOGLOBIN A1C: Hgb A1c MFr Bld: 5.6 % (ref 4.6–6.5)

## 2021-03-07 NOTE — Progress Notes (Signed)
1. Cholesterol is much higher. I would suggest starting atorvastatin 20 mg daily. Please send in if she agrees to take. Repeat lipids in 3 months. Start Lifestyle modifications: healthy eating, weight loss, increased physical activity.  ? ?All other labs look great. ? ? ? ?

## 2021-03-07 NOTE — Patient Instructions (Signed)
-  Nice seeing you today!! ? ?-Lab work today; will notify you once results are available. ? ?-Remember your COVID booster, tdap and shingles vaccines. ? ?-Schedule follow up in 1 year or sooner as needed. ? ? ?

## 2021-03-07 NOTE — Progress Notes (Signed)
? ? ? ?Established Patient Office Visit ? ? ? ? ?This visit occurred during the SARS-CoV-2 public health emergency.  Safety protocols were in place, including screening questions prior to the visit, additional usage of staff PPE, and extensive cleaning of exam room while observing appropriate contact time as indicated for disinfecting solutions.  ? ? ?CC/Reason for Visit: Annual preventive exam ? ?HPI: Brittney [name] is a 51 y.o. female who is coming in today for the above mentioned reasons. Past Medical History is significant for: Obesity, hyperlipidemia not on medication, asthma and insomnia on as needed alprazolam.  In October she had an appendectomy.  She has recovered well.  She has routine eye and dental care.  She had her 1 year repeat colonoscopy in February due to a large precancerous polyp removed the year before, this colonoscopy was clear and was advised a 3-year follow-up.  She is scheduled to see her gynecologist next week for GYN exam and mammogram.  She is overdue for COVID booster, shingles and Tdap.  She is feeling well and has no acute concerns or complaints. ? ? ?Past Medical/Surgical History: ?Past Medical History:  ?Diagnosis Date  ? Allergy   ? seasonal  ? Anemia   ? Anxiety   ? Asthma   ? Insomnia   ? Obesity (BMI 35.0-39.9 without comorbidity)   ? Oral herpes   ? ? ?Past Surgical History:  ?Procedure Laterality Date  ? APPENDECTOMY    ? BILATERAL SALPINGOOPHORECTOMY    ? ovaries left  ? CHOLECYSTECTOMY    ? COLONOSCOPY    ? LAPAROSCOPIC APPENDECTOMY N/A 10/20/2020  ? Procedure: APPENDECTOMY LAPAROSCOPIC;  Surgeon: Kinsinger, Arta Bruce, MD;  Location: [facility];  Service: General;  Laterality: N/A;  ? lapband    ? TONSILLECTOMY    ? tummy tuck    ? ? ?Social History: ? reports that she has never smoked. She has never used smokeless tobacco. She reports that she does not currently use alcohol. She reports that she does not use drugs. ? ?Allergies: ?Allergies  ?Allergen Reactions  ? Other    ?  All nuts  ? Peanut-Containing Drug Products Hives  ? Sulfa Antibiotics Nausea And Vomiting  ? ? ?Family History:  ?Family History  ?Problem Relation Age of Onset  ? Healthy Mother   ? Cancer Father   ? Pancreatic cancer Father   ? Breast cancer Maternal Grandmother   ? Colon cancer Neg Hx   ? Colon polyps Neg Hx   ? Esophageal cancer Neg Hx   ? Rectal cancer Neg Hx   ? Stomach cancer Neg Hx   ? ? ? ?Current Outpatient Medications:  ?  albuterol (VENTOLIN HFA) 108 (90 Base) MCG/ACT inhaler, Inhale 2 puffs into the lungs every 4 (four) hours as needed for wheezing or shortness of breath., Disp: 8.5 g, Rfl: 0 ?  ALPRAZolam (XANAX) 0.5 MG tablet, Take 1 tablet (0.5 mg total) by mouth daily as needed., Disp: 90 tablet, Rfl: 0 ?  Ascorbic Acid (VITAMIN C PO), Take by mouth daily., Disp: , Rfl:  ?  fluticasone (FLONASE) 50 MCG/ACT nasal spray, Place 2 sprays into both nostrils daily. (Patient taking differently: Place 2 sprays into both nostrils as needed.), Disp: 16 g, Rfl: 0 ?  loratadine (CLARITIN) 10 MG tablet, Take 10 mg by mouth as needed., Disp: , Rfl:  ?  Multiple Vitamin (MULTIVITAMIN) capsule, Take 1 capsule by mouth daily., Disp: , Rfl:  ?  Turmeric (QC TUMERIC COMPLEX PO),  Take by mouth daily., Disp: , Rfl:  ?  valACYclovir (VALTREX) 1000 MG tablet, TAKE 1 TABLET (1,000 MG TOTAL) BY MOUTH DAILY AS NEEDED., Disp: 90 tablet, Rfl: 1 ?  VITAMIN D PO, Take by mouth daily., Disp: , Rfl:  ? ?Review of Systems:  ?Constitutional: Denies fever, chills, diaphoresis, appetite change and fatigue.  ?HEENT: Denies photophobia, eye pain, redness, hearing loss, ear pain, congestion, sore throat, rhinorrhea, sneezing, mouth sores, trouble swallowing, neck pain, neck stiffness and tinnitus.   ?Respiratory: Denies SOB, DOE, cough, chest tightness,  and wheezing.   ?Cardiovascular: Denies chest pain, palpitations and leg swelling.  ?Gastrointestinal: Denies nausea, vomiting, abdominal pain, diarrhea, constipation, blood in  stool and abdominal distention.  ?Genitourinary: Denies dysuria, urgency, frequency, hematuria, flank pain and difficulty urinating.  ?Endocrine: Denies: hot or cold intolerance, sweats, changes in hair or nails, polyuria, polydipsia. ?Musculoskeletal: Denies myalgias, back pain, joint swelling, arthralgias and gait problem.  ?Skin: Denies pallor, rash and wound.  ?Neurological: Denies dizziness, seizures, syncope, weakness, light-headedness, numbness and headaches.  ?Hematological: Denies adenopathy. Easy bruising, personal or family bleeding history  ?Psychiatric/Behavioral: Denies suicidal ideation, mood changes, confusion, nervousness, sleep disturbance and agitation ? ? ? ?Physical Exam: ?Vitals:  ? 03/07/21 0838  ?BP: 124/84  ?Pulse: 70  ?Temp: 98 ?F (36.7 ?C)  ?TempSrc: Oral  ?SpO2: 99%  ?Weight: 232 lb 14.4 oz (105.6 kg)  ?Height: 5\' 7"  (1.702 m)  ? ? ?Body mass index is 36.48 kg/m?. ? ? ?Constitutional: NAD, calm, comfortable, obese ?Eyes: PERRL, lids and conjunctivae normal ?ENMT: Mucous membranes are moist. Posterior pharynx clear of any exudate or lesions. Normal dentition. Tympanic membrane is pearly white, no erythema or bulging. ?Neck: normal, supple, no masses, no thyromegaly ?Respiratory: clear to auscultation bilaterally, no wheezing, no crackles. Normal respiratory effort. No accessory muscle use.  ?Cardiovascular: Regular rate and rhythm, no murmurs / rubs / gallops. No extremity edema. 2+ pedal pulses. No carotid bruits.  ?Abdomen: no tenderness, no masses palpated. No hepatosplenomegaly. Bowel sounds positive.  ?Musculoskeletal: no clubbing / cyanosis. No joint deformity upper and lower extremities. Good ROM, no contractures. Normal muscle tone.  ?Skin: no rashes, lesions, ulcers. No induration ?Neurologic: CN 2-12 grossly intact. Sensation intact, DTR normal. Strength 5/5 in all 4.  ?Psychiatric: Normal judgment and insight. Alert and oriented x 3. Normal mood.  ? ? ?Impression and  Plan: ? ?Encounter for preventive health examination ?-Recommend routine eye and dental care. ?-Immunizations: Overdue for COVID booster, shingles and Tdap, declines all 3 today despite counseling ?-Healthy lifestyle discussed in detail. ?-Labs to be updated today. ?-Colon cancer screening: 02/2021, 3-year callback ?-Breast cancer screening: 04/2020 ?-Cervical cancer screening: Has appointment with GYN next week ?-Lung cancer screening: Not applicable ?-Prostate cancer screening: Not applicable ?-DEXA: Not applicable ? ?Insomnia, unspecified type ?-On as needed alprazolam, not requesting refills today. ? ?Hyperlipidemia, unspecified hyperlipidemia type  ?- Plan: Lipid panel ? ?Obesity (BMI 35.0-39.9 without comorbidity)  ?-Discussed healthy lifestyle, including increased physical activity and better food choices to promote weight loss. ? ? ? ?Patient Instructions  ?-Nice seeing you today!! ? ?-Lab work today; will notify you once results are available. ? ?-Remember your COVID booster, tdap and shingles vaccines. ? ?-Schedule follow up in 1 year or sooner as needed. ? ? ? ? ? ?Lelon Frohlich, MD ?[facility] ? ? ?

## 2021-03-13 ENCOUNTER — Other Ambulatory Visit: Payer: Self-pay | Admitting: Internal Medicine

## 2021-03-13 DIAGNOSIS — E785 Hyperlipidemia, unspecified: Secondary | ICD-10-CM

## 2021-03-13 MED ORDER — ATORVASTATIN CALCIUM 20 MG PO TABS: 20.0000 mg | ORAL_TABLET | Freq: Every day | ORAL | 1 refills | Status: DC

## 2021-04-11 ENCOUNTER — Other Ambulatory Visit: Payer: Self-pay | Admitting: Obstetrics and Gynecology

## 2021-04-11 DIAGNOSIS — Z1231 Encounter for screening mammogram for malignant neoplasm of breast: Secondary | ICD-10-CM

## 2021-05-16 ENCOUNTER — Telehealth: Payer: BC Managed Care – PPO | Admitting: Physician Assistant

## 2021-05-16 DIAGNOSIS — S60862A Insect bite (nonvenomous) of left wrist, initial encounter: Secondary | ICD-10-CM | POA: Diagnosis not present

## 2021-05-16 DIAGNOSIS — I891 Lymphangitis: Secondary | ICD-10-CM

## 2021-05-16 DIAGNOSIS — L0390 Cellulitis, unspecified: Secondary | ICD-10-CM | POA: Diagnosis not present

## 2021-05-16 DIAGNOSIS — W57XXXA Bitten or stung by nonvenomous insect and other nonvenomous arthropods, initial encounter: Secondary | ICD-10-CM | POA: Diagnosis not present

## 2021-05-16 MED ORDER — CEPHALEXIN 500 MG PO CAPS: 500.0000 mg | ORAL_CAPSULE | Freq: Four times a day (QID) | ORAL | 0 refills | Status: DC

## 2021-05-16 NOTE — Progress Notes (Signed)
Based on what you shared with me, I feel your condition warrants further evaluation and I recommend that you be seen in a face to face visit. ? ?Giving the streaking noted, there is a concern that you have infection in your lymphatic system. This requires an in-person examination and assessment to make sure proper treatment and follow-up is given.  ?  ?NOTE: There will be NO CHARGE for this eVisit ?  ?If you are having a true medical emergency please call 911.   ?  ? For an urgent face to face visit, [facility] has six urgent care centers for your convenience:  ?  ? [facility] [place] [facility] ?Get Driving Directions ?[phone] ?[address] ?[place], [place] [address] ?  ? [facility]) ?Get Driving Directions ?[phone] ?[address] ?[place], [place] [address] ? ?[facility] [place] [facility]) ?Get Driving Directions ?[place],  [place]  [address] ? ?[facility] ?Get Driving Directions ?[phone] ?[address], [address] ?[place], [place] [address] ?  ?[facility] ?Get Driving Directions  ?[phone] ?[address] ?[address] ?[place], [place] [address] ?  ? Urgent Care at [facility] ?Get Driving Directions ?[phone] ?[address] ?[place],  [address] ? ?Your MyChart E-visit questionnaire answers were reviewed by a board certified advanced clinical practitioner to complete your personal care plan based on your specific symptoms.  Thank you for using e-Visits. ?  ? ?

## 2021-05-16 NOTE — Progress Notes (Signed)
?Virtual Visit Consent  ? ?Dalbert Garnet, you are scheduled for a virtual visit with a [facility] provider today. Just as with appointments in the office, your consent must be obtained to participate. Your consent will be active for this visit and any virtual visit you may have with one of our providers in the next 365 days. If you have a MyChart account, a copy of this consent can be sent to you electronically. ? ?As this is a virtual visit, video technology does not allow for your provider to perform a traditional examination. This may limit your provider's ability to fully assess your condition. If your provider identifies any concerns that need to be evaluated in person or the need to arrange testing (such as labs, EKG, etc.), we will make arrangements to do so. Although advances in technology are sophisticated, we cannot ensure that it will always work on either your end or our end. If the connection with a video visit is poor, the visit may have to be switched to a telephone visit. With either a video or telephone visit, we are not always able to ensure that we have a secure connection. ? ?By engaging in this virtual visit, you consent to the provision of healthcare and authorize for your insurance to be billed (if applicable) for the services provided during this visit. Depending on your insurance coverage, you may receive a charge related to this service. ? ?I need to obtain your verbal consent now. Are you willing to proceed with your visit today? Brittney [name] has provided verbal consent on 05/16/2021 for a virtual visit (video or telephone). [name], PA-C ? ?Date: 05/16/2021 11:55 AM ? ?Virtual Visit via Video Note  ? ?IMar Daring, connected with  Brittney [name]  ([id number], 1971/01/04) on 05/16/21 at 11:45 AM EDT by a video-enabled telemedicine application and verified that I am speaking with the correct person using two identifiers. ? ?Location: ?Patient: Virtual  Visit Location Patient: Home ?Provider: Virtual Visit Location Provider: Home Office ?  ?I discussed the limitations of evaluation and management by telemedicine and the availability of in person appointments. The patient expressed understanding and agreed to proceed.   ? ?History of Present Illness: ?Brittney [name] is a 51 y.o. who identifies as a female who was assigned female at birth, and is being seen today for insect bite that may be infected. Was gardening outside and was bit by a red ant on Saturday. Benadryl spray for itching. Was bit on the dorsal aspect of the left hand. Starting last night she noticed the redness around the bite increasing and now has a red streak coming from the wound leading distally to about the 1st MCP joint. Denies fevers, chills, nausea, and vomiting, and discharge from the wound.  ? ? ? ?Problems:  ?Patient Active Problem List  ? Diagnosis Date Noted  ? Appendicitis 10/20/2020  ? Acute appendicitis 10/20/2020  ? COVID-19 virus infection 05/10/2020  ? Hyperlipidemia 02/24/2020  ? Obesity (BMI 35.0-39.9 without comorbidity)   ? Insomnia   ? Oral herpes   ? Anemia   ? Asthma   ?  ?Allergies:  ?Allergies  ?Allergen Reactions  ? Other   ?  All nuts  ? Peanut-Containing Drug Products Hives  ? Sulfa Antibiotics Nausea And Vomiting  ? ?Medications:  ?Current Outpatient Medications:  ?  cephALEXin (KEFLEX) 500 MG capsule, Take 1 capsule (500 mg total) by mouth 4 (four) times daily for 7 days., Disp:  28 capsule, Rfl: 0 ?  albuterol (VENTOLIN HFA) 108 (90 Base) MCG/ACT inhaler, Inhale 2 puffs into the lungs every 4 (four) hours as needed for wheezing or shortness of breath., Disp: 8.5 g, Rfl: 0 ?  ALPRAZolam (XANAX) 0.5 MG tablet, Take 1 tablet (0.5 mg total) by mouth daily as needed., Disp: 90 tablet, Rfl: 0 ?  Ascorbic Acid (VITAMIN C PO), Take by mouth daily., Disp: , Rfl:  ?  atorvastatin (LIPITOR) 20 MG tablet, Take 1 tablet (20 mg total) by mouth daily., Disp: 90 tablet, Rfl: 1 ?   fluticasone (FLONASE) 50 MCG/ACT nasal spray, Place 2 sprays into both nostrils daily. (Patient taking differently: Place 2 sprays into both nostrils as needed.), Disp: 16 g, Rfl: 0 ?  loratadine (CLARITIN) 10 MG tablet, Take 10 mg by mouth as needed., Disp: , Rfl:  ?  Multiple Vitamin (MULTIVITAMIN) capsule, Take 1 capsule by mouth daily., Disp: , Rfl:  ?  Turmeric (QC TUMERIC COMPLEX PO), Take by mouth daily., Disp: , Rfl:  ?  valACYclovir (VALTREX) 1000 MG tablet, TAKE 1 TABLET (1,000 MG TOTAL) BY MOUTH DAILY AS NEEDED., Disp: 90 tablet, Rfl: 1 ?  VITAMIN D PO, Take by mouth daily., Disp: , Rfl:  ? ?Observations/Objective: ?Patient is well-developed, well-nourished in no acute distress.  ?Resting comfortably at home.  ?Head is normocephalic, atraumatic.  ?No labored breathing.  ?Speech is clear and coherent with logical content.  ?Patient is alert and oriented at baseline.  ?Erythematous lesion with central bite covered with small scab located on the dorsal left hand near the Carpometacarpal area of the first finger with an erythematous thin streak coming from wound to about the 1st MCP joint  ? ?Assessment and Plan: ?1. Wound cellulitis ?- cephALEXin (KEFLEX) 500 MG capsule; Take 1 capsule (500 mg total) by mouth 4 (four) times daily for 7 days.  Dispense: 28 capsule; Refill: 0 ? ?2. Insect bite of left wrist, initial encounter ? ?3. Lymphangitis ? ?- Advised to outline redness and mark end of streak at this time ?- Start Keflex ?- Monitor closely for spreading of redness outside of current borders, or any signs of developing systemic illness she is to seek in person evaluation at local ER ?- Apply benadryl and hydrocortisone topically for itching ?- Cool compresses, limit hot water exposure ?- Seek further evaluation if not improving ? ? ?Follow Up Instructions: ?I discussed the assessment and treatment plan with the patient. The patient was provided an opportunity to ask questions and all were answered. The  patient agreed with the plan and demonstrated an understanding of the instructions.  A copy of instructions were sent to the patient via MyChart unless otherwise noted below.  ? ? ?The patient was advised to call back or seek an in-person evaluation if the symptoms worsen or if the condition fails to improve as anticipated. ? ?Time:  ?I spent 13 minutes with the patient via telehealth technology discussing the above problems/concerns.   ? ?[name], PA-C ?

## 2021-05-16 NOTE — Patient Instructions (Signed)
?Karisma Meiser, thank you for joining [name], PA-C for today's virtual visit.  While this provider is not your primary care provider (PCP), if your PCP is located in our provider database this encounter information will be shared with them immediately following your visit. ? ?Consent: ?(Patient) Brittney [name] provided verbal consent for this virtual visit at the beginning of the encounter. ? ?Current Medications: ? ?Current Outpatient Medications:  ?  cephALEXin (KEFLEX) 500 MG capsule, Take 1 capsule (500 mg total) by mouth 4 (four) times daily for 7 days., Disp: 28 capsule, Rfl: 0 ?  albuterol (VENTOLIN HFA) 108 (90 Base) MCG/ACT inhaler, Inhale 2 puffs into the lungs every 4 (four) hours as needed for wheezing or shortness of breath., Disp: 8.5 g, Rfl: 0 ?  ALPRAZolam (XANAX) 0.5 MG tablet, Take 1 tablet (0.5 mg total) by mouth daily as needed., Disp: 90 tablet, Rfl: 0 ?  Ascorbic Acid (VITAMIN C PO), Take by mouth daily., Disp: , Rfl:  ?  atorvastatin (LIPITOR) 20 MG tablet, Take 1 tablet (20 mg total) by mouth daily., Disp: 90 tablet, Rfl: 1 ?  fluticasone (FLONASE) 50 MCG/ACT nasal spray, Place 2 sprays into both nostrils daily. (Patient taking differently: Place 2 sprays into both nostrils as needed.), Disp: 16 g, Rfl: 0 ?  loratadine (CLARITIN) 10 MG tablet, Take 10 mg by mouth as needed., Disp: , Rfl:  ?  Multiple Vitamin (MULTIVITAMIN) capsule, Take 1 capsule by mouth daily., Disp: , Rfl:  ?  Turmeric (QC TUMERIC COMPLEX PO), Take by mouth daily., Disp: , Rfl:  ?  valACYclovir (VALTREX) 1000 MG tablet, TAKE 1 TABLET (1,000 MG TOTAL) BY MOUTH DAILY AS NEEDED., Disp: 90 tablet, Rfl: 1 ?  VITAMIN D PO, Take by mouth daily., Disp: , Rfl:   ? ?Medications ordered in this encounter:  ?Meds ordered this encounter  ?Medications  ? cephALEXin (KEFLEX) 500 MG capsule  ?  Sig: Take 1 capsule (500 mg total) by mouth 4 (four) times daily for 7 days.  ?  Dispense:  28 capsule  ?  Refill:  0  ?   Order Specific Question:   Supervising Provider  ?  Answer:   Noemi Chapel [[id number]]  ?  ? ?*If you need refills on other medications prior to your next appointment, please contact your pharmacy* ? ?Follow-Up: ?Call back or seek an in-person evaluation if the symptoms worsen or if the condition fails to improve as anticipated. ? ?Other Instructions ? ? ?Lymphangitis, Adult ? ?Lymphangitis is inflammation of one or more lymph vessels. This condition is usually caused by an infection with bacteria. The lymphatic system is part of the body's defense system (immune system). It is a network of vessels, glands, and organs that carry fluid (lymph) and other substances around the body. Lymph vessels drain into glands called lymph nodes. These nodes remove bacteria, viruses, and waste products from lymph to keep them from spreading through the body. ?Lymphangitis causes red streaks, swelling, and skin soreness in the area of the affected lymph vessels. Starting treatment right away is important because this condition can quickly get worse and lead to serious illness. It can spread quickly through your lymph system and into your blood (bacteremia). ?What are the causes? ?This condition is usually caused by a bacterial infection of the skin. The bacteria may enter the body through an injury to the skin, such as a cut, scratch, surgical incision, or insect bite. Lymphangitis usually results from an infection with  streptococcus or staphylococcus bacteria, but it may also be caused by other infections. ?What increases the risk? ?The following factors may make you more likely to develop this condition: ?Being female. Men are more likely to get lymphangitis caused by cellulitis. ?Having a decreased ability to fight infection or a weakened immune system. ?Having diabetes. ?Taking drugs that suppress the immune system. ?Having chickenpox. ?Being weak from another illness. ?What are the signs or symptoms? ?The most common symptom of  lymphangitis is a wound or skin infection that develops red streaks in the skin. These are the infected lymph vessels. The red streaks will extend toward the lymph nodes that drain the vessels.  ?Other symptoms may include: ?Warmth and tenderness over the streaks. ?Throbbing pain. ?Swollen and tender lymph nodes. ?For arm infections, these will be under the arm. ?For leg infections, these will be in the groin area. ?Fever. ?Chills. ?Headache. ?Appetite loss. ?Muscle aches. ?Fast pulse. ?How is this diagnosed? ?This condition may be diagnosed based on your symptoms and a physical exam. You may also have tests, such as: ?Blood tests to check for an increase in white blood cells. ?Blood cultures to look for bacteremia. ?Culture and sensitivity testing. This is a test to find out what type of bacteria will grow from a sample of pus swabbed from the wound or skin infection. The results help determine which antibiotic medicines will kill the bacteria. ?X-rays. These may be needed if you have a red or swollen joint. In this case, you may also be referred to a bone specialist. ?How is this treated? ?Treatment for this condition may include: ?Antibiotics. ?You may be started on an antibiotic that is known to kill both streptococcus and staphylococcus bacteria. ?Your antibiotics may need to be switched if tests show that your condition is caused by another type of bacteria. ?If your infection is very bad or has spread to another area of your body, you may need to get antibiotics given directly into a vein through an IV at the hospital. ?Pain medicine. ?Incision and drainage. This is a procedure that may be done at the hospital if pus needs to be drained from any wound. ?Follow these instructions at home: ?Take over-the-counter and prescription medicines only as told by your health care provider. ?Take your antibiotic medicine as told by your health care provider. Do not stop taking the antibiotic even if you start to feel  better. ?Rest at home until your health care provider says that you can return to your normal activities. ?Drink enough fluid to keep your urine pale yellow. ?Follow instructions from your health care provider about how to take care of any wound. ?Raise (elevate) the affected area above the level of your heart while you are sitting or lying down. ?Keep all follow-up visits as told by your health care provider. This is important. ?Contact a health care provider if: ?You have chills or a fever. ?Your symptoms do not go away or they get worse with treatment. ?Your symptoms come back after treatment. ?Get help right away if you have: ?A headache or a stiff neck. ?Chest pain. ?Trouble breathing. ?Summary ?Lymphangitis is inflammation of one or more lymph vessels. It is usually caused by a bacterial infection. ?Take your antibiotic medicine as told by your health care provider. Do not stop taking the antibiotic even if you start to feel better. ?Rest and drink plenty of fluids. ?Keep all follow-up visits as told by your health care provider. This is important. ?This information is not  intended to replace advice given to you by your health care provider. Make sure you discuss any questions you have with your health care provider. ?Document Revised: 05/13/2020 Document Reviewed: 05/13/2020 ?Elsevier Patient Education ? [employer]. ? ? ?Insect Bite, Adult ?An insect bite can make your skin red, itchy, and swollen. An insect bite is different from an insect sting, which happens when an insect injects poison (venom) into the skin. ?Some insects can spread disease to people through a bite. However, most insect bites do not lead to disease and are not serious. ?What are the causes? ?Insects may bite for a variety of reasons, including: ?Hunger. ?To defend themselves. ?Insects that bite include: ?Spiders. ?Mosquitoes. ?Ticks. ?Fleas. ?Ants. ?Flies. ?Kissing bugs. ?Chiggers. ?What are the signs or symptoms? ?Symptoms of  this condition include: ?Itching or pain in the bite area. ?Redness and swelling in the bite area. ?An open wound (skin ulcer). ?In many cases, symptoms last for 2-4 days. ?In rare cases, a person may have a se

## 2021-05-23 ENCOUNTER — Telehealth: Payer: BC Managed Care – PPO | Admitting: Physician Assistant

## 2021-05-23 DIAGNOSIS — L0390 Cellulitis, unspecified: Secondary | ICD-10-CM

## 2021-05-23 MED ORDER — CEPHALEXIN 500 MG PO CAPS
500.0000 mg | ORAL_CAPSULE | Freq: Four times a day (QID) | ORAL | 0 refills | Status: AC
Start: 2021-05-23 — End: 2021-05-26

## 2021-05-23 NOTE — Patient Instructions (Signed)
?  Dalbert Garnet, thank you for joining Leeanne Rio, PA-C for today's virtual visit.  While this provider is not your primary care provider (PCP), if your PCP is located in our provider database this encounter information will be shared with them immediately following your visit. ? ?Consent: ?(Patient) Brittney [name] provided verbal consent for this virtual visit at the beginning of the encounter. ? ?Current Medications: ? ?Current Outpatient Medications:  ?  albuterol (VENTOLIN HFA) 108 (90 Base) MCG/ACT inhaler, Inhale 2 puffs into the lungs every 4 (four) hours as needed for wheezing or shortness of breath., Disp: 8.5 g, Rfl: 0 ?  ALPRAZolam (XANAX) 0.5 MG tablet, Take 1 tablet (0.5 mg total) by mouth daily as needed., Disp: 90 tablet, Rfl: 0 ?  Ascorbic Acid (VITAMIN C PO), Take by mouth daily., Disp: , Rfl:  ?  atorvastatin (LIPITOR) 20 MG tablet, Take 1 tablet (20 mg total) by mouth daily., Disp: 90 tablet, Rfl: 1 ?  cephALEXin (KEFLEX) 500 MG capsule, Take 1 capsule (500 mg total) by mouth 4 (four) times daily for 7 days., Disp: 28 capsule, Rfl: 0 ?  fluticasone (FLONASE) 50 MCG/ACT nasal spray, Place 2 sprays into both nostrils daily. (Patient taking differently: Place 2 sprays into both nostrils as needed.), Disp: 16 g, Rfl: 0 ?  loratadine (CLARITIN) 10 MG tablet, Take 10 mg by mouth as needed., Disp: , Rfl:  ?  Multiple Vitamin (MULTIVITAMIN) capsule, Take 1 capsule by mouth daily., Disp: , Rfl:  ?  Turmeric (QC TUMERIC COMPLEX PO), Take by mouth daily., Disp: , Rfl:  ?  valACYclovir (VALTREX) 1000 MG tablet, TAKE 1 TABLET (1,000 MG TOTAL) BY MOUTH DAILY AS NEEDED., Disp: 90 tablet, Rfl: 1 ?  VITAMIN D PO, Take by mouth daily., Disp: , Rfl:   ? ?Medications ordered in this encounter:  ?No orders of the defined types were placed in this encounter. ?  ? ?*If you need refills on other medications prior to your next appointment, please contact your pharmacy* ? ?Follow-Up: ?Call back or seek  an in-person evaluation if the symptoms worsen or if the condition fails to improve as anticipated. ? ?Other Instructions ?Please continue antibiotic for 3 more days.  ?I have sent in a new script for you. ?Keep the area clean and dry. ?Symptoms should only continue resolving from this point forward. ?Any recurrence of symptoms or any new symptoms -- in-person evaluation ASAP.  ? ? ?If you have been instructed to have an in-person evaluation today at a local Urgent Care facility, please use the link below. It will take you to a list of all of our available [facility], including address, phone number and hours of operation. Please do not delay care.  ?[facility] ? ?If you or a family member do not have a primary care provider, use the link below to schedule a visit and establish care. When you choose a [facility] primary care physician or advanced practice provider, you gain a long-term partner in health. ?Find a Primary Care Provider ? ?Learn more about [facility]'s in-office and virtual care options: ?[facility]  ?

## 2021-05-23 NOTE — Progress Notes (Signed)
?Virtual Visit Consent  ? ?Brittney [name], you are scheduled for a virtual visit with a [facility] provider today. Just as with appointments in the office, your consent must be obtained to participate. Your consent will be active for this visit and any virtual visit you may have with one of our providers in the next 365 days. If you have a MyChart account, a copy of this consent can be sent to you electronically. ? ?As this is a virtual visit, video technology does not allow for your provider to perform a traditional examination. This may limit your provider's ability to fully assess your condition. If your provider identifies any concerns that need to be evaluated in person or the need to arrange testing (such as labs, EKG, etc.), we will make arrangements to do so. Although advances in technology are sophisticated, we cannot ensure that it will always work on either your end or our end. If the connection with a video visit is poor, the visit may have to be switched to a telephone visit. With either a video or telephone visit, we are not always able to ensure that we have a secure connection. ? ?By engaging in this virtual visit, you consent to the provision of healthcare and authorize for your insurance to be billed (if applicable) for the services provided during this visit. Depending on your insurance coverage, you may receive a charge related to this service. ? ?I need to obtain your verbal consent now. Are you willing to proceed with your visit today? Brittney [name] has provided verbal consent on 05/23/2021 for a virtual visit (video or telephone). Brittney Rio, PA-C ? ?Date: 05/23/2021 1:37 PM ? ?Virtual Visit via Video Note  ? ?ILeeanne [name], connected with  Brittney [name]  ([id number], 1970-10-18) on 05/23/21 at  1:15 PM EDT by a video-enabled telemedicine application and verified that I am speaking with the correct person using two identifiers. ? ?Location: ?Patient: Virtual  Visit Location Patient: Home ?Provider: Virtual Visit Location Provider: Home Office ?  ?I discussed the limitations of evaluation and management by telemedicine and the availability of in person appointments. The patient expressed understanding and agreed to proceed.   ? ?History of Present Illness: ?Brittney [name] is a 51 y.o. who identifies as a female who was assigned female at birth, and is being seen today for follow-up of cellulitis and lymphangitis of L hand after an ant bite. Was seen on 05/16/2021 and started on Keflex 500 mg QID x 7 days. Was given very strict ER precautions. Endorses taking her antibiotics as directed with good improvement in her symptoms. Notes only slight tenderness still present. No increasing area of redness or further movement of the cellulitic streaking. Denies fever, shills. Still with some residual redness so was unsure is antibiotic should be extended. She is on her last dose of medication currently. .  ? ?HPI: HPI  ?Problems:  ?Patient Active Problem List  ? Diagnosis Date Noted  ? Appendicitis 10/20/2020  ? Acute appendicitis 10/20/2020  ? COVID-19 virus infection 05/10/2020  ? Hyperlipidemia 02/24/2020  ? Obesity (BMI 35.0-39.9 without comorbidity)   ? Insomnia   ? Oral herpes   ? Anemia   ? Asthma   ?  ?Allergies:  ?Allergies  ?Allergen Reactions  ? Other   ?  All nuts  ? Peanut-Containing Drug Products Hives  ? Sulfa Antibiotics Nausea And Vomiting  ? ?Medications:  ?Current Outpatient Medications:  ?  albuterol (VENTOLIN HFA)  108 (90 Base) MCG/ACT inhaler, Inhale 2 puffs into the lungs every 4 (four) hours as needed for wheezing or shortness of breath., Disp: 8.5 g, Rfl: 0 ?  ALPRAZolam (XANAX) 0.5 MG tablet, Take 1 tablet (0.5 mg total) by mouth daily as needed., Disp: 90 tablet, Rfl: 0 ?  Ascorbic Acid (VITAMIN C PO), Take by mouth daily., Disp: , Rfl:  ?  atorvastatin (LIPITOR) 20 MG tablet, Take 1 tablet (20 mg total) by mouth daily., Disp: 90 tablet, Rfl: 1 ?   cephALEXin (KEFLEX) 500 MG capsule, Take 1 capsule (500 mg total) by mouth 4 (four) times daily for 3 days., Disp: 12 capsule, Rfl: 0 ?  fluticasone (FLONASE) 50 MCG/ACT nasal spray, Place 2 sprays into both nostrils daily. (Patient taking differently: Place 2 sprays into both nostrils as needed.), Disp: 16 g, Rfl: 0 ?  loratadine (CLARITIN) 10 MG tablet, Take 10 mg by mouth as needed., Disp: , Rfl:  ?  Multiple Vitamin (MULTIVITAMIN) capsule, Take 1 capsule by mouth daily., Disp: , Rfl:  ?  Turmeric (QC TUMERIC COMPLEX PO), Take by mouth daily., Disp: , Rfl:  ?  valACYclovir (VALTREX) 1000 MG tablet, TAKE 1 TABLET (1,000 MG TOTAL) BY MOUTH DAILY AS NEEDED., Disp: 90 tablet, Rfl: 1 ?  VITAMIN D PO, Take by mouth daily., Disp: , Rfl:  ? ?Observations/Objective: ?Patient is well-developed, well-nourished in no acute distress.  ?Resting comfortably at home.  ?Head is normocephalic, atraumatic.  ?No labored breathing. ?Speech is clear and coherent with logical content.  ?Patient is alert and oriented at baseline.  ?L dorsal hand examined. 1 cm bite noted, now with scabbing. Some mild erythema just around this, ~ 2 cm in diameter. The "streaking" from before is a small linear area of redness but is extended distally from the bite instead of proximally as expected. This area seems to be lightening per patient.  ? ?Assessment and Plan: ?1. Wound cellulitis ?- cephALEXin (KEFLEX) 500 MG capsule; Take 1 capsule (500 mg total) by mouth 4 (four) times daily for 3 days.  Dispense: 12 capsule; Refill: 0 ? ?Mostly resolved. Doubtful true lymphangitis since the small line of redness is distal to the lesion as it should be ascending proximally in the lymph vessels. This is very reassuring. Will extend antibiotic for 3 more days to compete a total of 10 days of antibiotic. Strict return precautions reviewed with patient.  ? ?Follow Up Instructions: ?I discussed the assessment and treatment plan with the patient. The patient was  provided an opportunity to ask questions and all were answered. The patient agreed with the plan and demonstrated an understanding of the instructions.  A copy of instructions were sent to the patient via MyChart unless otherwise noted below.  ? ?The patient was advised to call back or seek an in-person evaluation if the symptoms worsen or if the condition fails to improve as anticipated. ? ?Time:  ?I spent 10 minutes with the patient via telehealth technology discussing the above problems/concerns.   ? ?Brittney Rio, PA-C ?

## 2021-06-08 ENCOUNTER — Ambulatory Visit
Admission: RE | Admit: 2021-06-08 | Discharge: 2021-06-08 | Disposition: A | Discharge status: Home / Self Care | Payer: BC Managed Care – PPO | Source: Ambulatory Visit | Attending: Obstetrics and Gynecology | Admitting: Obstetrics and Gynecology

## 2021-06-08 DIAGNOSIS — Z1231 Encounter for screening mammogram for malignant neoplasm of breast: Secondary | ICD-10-CM | POA: Insufficient documentation

## 2021-06-12 ENCOUNTER — Telehealth: Payer: BC Managed Care – PPO

## 2021-06-12 ENCOUNTER — Emergency Department (HOSPITAL_BASED_OUTPATIENT_CLINIC_OR_DEPARTMENT_OTHER)
Admission: EM | Admit: 2021-06-12 | Discharge: 2021-06-12 | Disposition: A | Discharge status: Home / Self Care | Payer: BC Managed Care – PPO | Attending: Emergency Medicine | Admitting: Emergency Medicine

## 2021-06-12 ENCOUNTER — Encounter (HOSPITAL_BASED_OUTPATIENT_CLINIC_OR_DEPARTMENT_OTHER): Payer: Self-pay | Admitting: Obstetrics and Gynecology

## 2021-06-12 ENCOUNTER — Ambulatory Visit
Admission: EM | Admit: 2021-06-12 | Discharge: 2021-06-12 | Discharge status: Against Medical Advice | Payer: BC Managed Care – PPO

## 2021-06-12 ENCOUNTER — Encounter: Payer: Self-pay | Admitting: Internal Medicine

## 2021-06-12 ENCOUNTER — Other Ambulatory Visit: Payer: Self-pay

## 2021-06-12 DIAGNOSIS — T7840XA Allergy, unspecified, initial encounter: Secondary | ICD-10-CM | POA: Insufficient documentation

## 2021-06-12 DIAGNOSIS — Z91010 Allergy to peanuts: Secondary | ICD-10-CM | POA: Insufficient documentation

## 2021-06-12 DIAGNOSIS — Z7951 Long term (current) use of inhaled steroids: Secondary | ICD-10-CM | POA: Diagnosis not present

## 2021-06-12 DIAGNOSIS — J45909 Unspecified asthma, uncomplicated: Secondary | ICD-10-CM | POA: Diagnosis not present

## 2021-06-12 LAB — CBC WITH DIFFERENTIAL/PLATELET
Abs Immature Granulocytes: 0.01 10*3/uL (ref 0.00–0.07)
Basophils Absolute: 0.1 10*3/uL (ref 0.0–0.1)
Basophils Relative: 1 %
Eosinophils Absolute: 0.3 10*3/uL (ref 0.0–0.5)
Eosinophils Relative: 4 %
HCT: 44.5 % (ref 36.0–46.0)
Hemoglobin: 14.4 g/dL (ref 12.0–15.0)
Immature Granulocytes: 0 %
Lymphocytes Relative: 33 %
Lymphs Abs: 2.1 10*3/uL (ref 0.7–4.0)
MCH: 29 pg (ref 26.0–34.0)
MCHC: 32.4 g/dL (ref 30.0–36.0)
MCV: 89.5 fL (ref 80.0–100.0)
Monocytes Absolute: 0.5 10*3/uL (ref 0.1–1.0)
Monocytes Relative: 9 %
Neutro Abs: 3.3 10*3/uL (ref 1.7–7.7)
Neutrophils Relative %: 53 %
Platelets: 279 10*3/uL (ref 150–400)
RBC: 4.97 MIL/uL (ref 3.87–5.11)
RDW: 13.4 % (ref 11.5–15.5)
WBC: 6.2 10*3/uL (ref 4.0–10.5)
nRBC: 0 % (ref 0.0–0.2)

## 2021-06-12 LAB — BASIC METABOLIC PANEL
Anion gap: 11 (ref 5–15)
BUN: 16 mg/dL (ref 6–20)
CO2: 27 mmol/L (ref 22–32)
Calcium: 10.2 mg/dL (ref 8.9–10.3)
Chloride: 100 mmol/L (ref 98–111)
Creatinine, Ser: 0.83 mg/dL (ref 0.44–1.00)
GFR, Estimated: >60 mL/min (ref >60)
Glucose, Bld: 91 mg/dL (ref 70–99)
Potassium: 3.8 mmol/L (ref 3.5–5.1)
Sodium: 138 mmol/L (ref 135–145)

## 2021-06-12 MED ORDER — EPINEPHRINE 0.3 MG/0.3ML IJ SOAJ: 0.3000 mg | INTRAMUSCULAR | 0 refills | Status: DC | PRN

## 2021-06-12 MED ORDER — DIPHENHYDRAMINE HCL 50 MG/ML IJ SOLN
25.0000 mg | Freq: Once | INTRAMUSCULAR | Status: AC
  Administered 2021-06-12: 25 mg via INTRAVENOUS
  Filled 2021-06-12: qty 1

## 2021-06-12 MED ORDER — METHYLPREDNISOLONE SODIUM SUCC 125 MG IJ SOLR
125.0000 mg | Freq: Once | INTRAMUSCULAR | Status: AC
  Administered 2021-06-12: 125 mg via INTRAVENOUS
  Filled 2021-06-12: qty 2

## 2021-06-12 MED ORDER — IPRATROPIUM-ALBUTEROL 0.5-2.5 (3) MG/3ML IN SOLN
3.0000 mL | Freq: Once | RESPIRATORY_TRACT | Status: AC
Start: 2021-06-12 — End: 2021-06-12
  Administered 2021-06-12: 3 mL via RESPIRATORY_TRACT
  Filled 2021-06-12: qty 3

## 2021-06-12 MED ORDER — ALBUTEROL SULFATE HFA 108 (90 BASE) MCG/ACT IN AERS
2.0000 | INHALATION_SPRAY | Freq: Once | RESPIRATORY_TRACT | Status: AC
  Administered 2021-06-12: 2 via RESPIRATORY_TRACT
  Filled 2021-06-12: qty 6.7

## 2021-06-12 MED ORDER — PREDNISONE 50 MG PO TABS: 50.0000 mg | ORAL_TABLET | Freq: Every day | ORAL | 0 refills | Status: DC

## 2021-06-12 MED ORDER — AEROCHAMBER Z-STAT PLUS/MEDIUM MISC
1.0000 | Freq: Once | Status: DC
  Filled 2021-06-12: qty 1

## 2021-06-12 MED ORDER — FAMOTIDINE IN NACL 20-0.9 MG/50ML-% IV SOLN
20.0000 mg | Freq: Once | INTRAVENOUS | Status: AC
  Administered 2021-06-12: 20 mg via INTRAVENOUS
  Filled 2021-06-12: qty 50

## 2021-06-12 NOTE — Telephone Encounter (Signed)
Transition Care Management Follow-up Telephone Call Date of discharge and from where: [facility] 06/12/21 How have you been since you were released from the hospital? Pt states she received a breathing treatment & epi pen. Pt states she feels better. Any questions or concerns? No  Items Reviewed: Did the pt receive and understand the discharge instructions provided? Yes  Medications obtained and verified? Yes  Other? No  Any new allergies since your discharge? No  Dietary orders reviewed? No   Home Care and Equipment/Supplies: Were home health services ordered? not applicable   Functional Questionnaire: (I = Independent and D = Dependent) ADLs: I  Bathing/Dressing- I  Meal Prep- I  Eating- I  Maintaining continence- I  Transferring/Ambulation- I  Managing Meds- I  Follow up appointments reviewed:  PCP Hospital f/u appt confirmed? No  Pt declines appt at this time. [facility] f/u appt confirmed?  N/A   If their condition worsens, is the pt aware to call PCP or go to the Emergency Dept.? Yes Was the patient provided with contact information for the PCP's office or ED? Yes Was to pt encouraged to call back with questions or concerns? Yes

## 2021-06-12 NOTE — ED Notes (Signed)
Patient arrives to the department with complaints of shortness of breath. Patient BBS clear, no stridor noted, and airway appears intact. Patient able to complete sentences. VSS in triage.

## 2021-06-12 NOTE — ED Provider Notes (Signed)
[facility] EMER[facility] DEPT Provider Note   CSN: [id number] Arrival date & time: 06/12/21  4098     History  Chief Complaint  Patient presents with   Allergic Reaction    Brittney [name] is a 51 y.o. female.  Pt is a 51 yo female with pmhx significant for anemia, asthma, and seasonal allergies.  She was stung by a bee last night around 2030.  She took a claritin and went to sleep.  She woke up this am with her left eye swollen.  Her throat is itchy.  Her chest feels tight.  Pt took some allergy eye drops this am, but sx still persist.        Home Medications Prior to Admission medications   Medication Sig Start Date End Date Taking? Authorizing Provider  EPINEPHrine 0.3 mg/0.3 mL IJ SOAJ injection Inject 0.3 mg into the muscle as needed for anaphylaxis. 06/12/21  Yes Isla Pence, MD  predniSONE (DELTASONE) 50 MG tablet Take 1 tablet (50 mg total) by mouth daily with breakfast. 06/12/21  Yes Isla Pence, MD  albuterol (VENTOLIN HFA) 108 (90 Base) MCG/ACT inhaler Inhale 2 puffs into the lungs every 4 (four) hours as needed for wheezing or shortness of breath. 11/14/20   Isaac Bliss, Rayford Halsted, MD  ALPRAZolam Duanne Moron) 0.5 MG tablet Take 1 tablet (0.5 mg total) by mouth daily as needed. 11/14/20   Isaac Bliss, Rayford Halsted, MD  Ascorbic Acid (VITAMIN C PO) Take by mouth daily.    [provider]  atorvastatin (LIPITOR) 20 MG tablet Take 1 tablet (20 mg total) by mouth daily. 03/13/21   Isaac Bliss, Rayford Halsted, MD  fluticasone (FLONASE) 50 MCG/ACT nasal spray Place 2 sprays into both nostrils daily. Patient taking differently: Place 2 sprays into both nostrils as needed. 12/19/20   Brunetta Jeans, PA-C  loratadine (CLARITIN) 10 MG tablet Take 10 mg by mouth as needed.    [provider]  Multiple Vitamin (MULTIVITAMIN) capsule Take 1 capsule by mouth daily.    [provider]  Turmeric (QC TUMERIC COMPLEX PO) Take by mouth daily.     [provider]  valACYclovir (VALTREX) 1000 MG tablet TAKE 1 TABLET (1,000 MG TOTAL) BY MOUTH DAILY AS NEEDED. 12/14/19   Isaac Bliss, Rayford Halsted, MD  VITAMIN D PO Take by mouth daily.    [provider]      Allergies    Other, Peanut-containing drug products, and Sulfa antibiotics    Review of Systems   Review of Systems  Respiratory:  Positive for shortness of breath.   All other systems reviewed and are negative.  Physical Exam Updated Vital Signs BP 107/68   Pulse 65   Temp 97.9 F (36.6 C)   Resp 18   Ht '5\' 7"'$  (1.702 m)   Wt 99.8 kg   LMP 01/18/2021 (Approximate)   SpO2 100%   BMI 34.46 kg/m  Physical Exam Vitals and nursing note reviewed.  Constitutional:      Appearance: Normal appearance.  HENT:     Head: Normocephalic and atraumatic.     Comments: Hive under left eye    Right Ear: External ear normal.     Left Ear: External ear normal.     Nose: Nose normal.     Mouth/Throat:     Mouth: Mucous membranes are moist.     Pharynx: Oropharynx is clear.  Eyes:     Extraocular Movements: Extraocular movements intact.     Conjunctiva/sclera:  Conjunctivae normal.     Pupils: Pupils are equal, round, and reactive to light.  Cardiovascular:     Rate and Rhythm: Normal rate and regular rhythm.     Pulses: Normal pulses.     Heart sounds: Normal heart sounds.  Pulmonary:     Effort: Pulmonary effort is normal.     Breath sounds: Normal breath sounds.  Abdominal:     General: Abdomen is flat. Bowel sounds are normal.     Palpations: Abdomen is soft.  Musculoskeletal:        General: Normal range of motion.     Cervical back: Normal range of motion and neck supple.  Skin:    General: Skin is warm.     Capillary Refill: Capillary refill takes less than 2 seconds.     Comments: Bee sting to right tricep.  Quarter sized.  Neurological:     General: No focal deficit present.     Mental Status: She is alert and oriented to person, place, and  time.  Psychiatric:        Mood and Affect: Mood normal.        Behavior: Behavior normal.    ED Results / Procedures / Treatments   Labs (all labs ordered are listed, but only abnormal results are displayed) Labs Reviewed  BASIC METABOLIC PANEL  CBC WITH DIFFERENTIAL/PLATELET    EKG None  Radiology No results found.  Procedures Procedures    Medications Ordered in ED Medications  methylPREDNISolone sodium succinate (SOLU-MEDROL) 125 mg/2 mL injection 125 mg (125 mg Intravenous Given 06/12/21 1031)  famotidine (PEPCID) IVPB 20 mg premix (0 mg Intravenous Stopped 06/12/21 1109)  diphenhydrAMINE (BENADRYL) injection 25 mg (25 mg Intravenous Given 06/12/21 1028)  albuterol (VENTOLIN HFA) 108 (90 Base) MCG/ACT inhaler 2 puff (2 puffs Inhalation Given 06/12/21 1037)  ipratropium-albuterol (DUONEB) 0.5-2.5 (3) MG/3ML nebulizer solution 3 mL (3 mLs Nebulization Given 06/12/21 1159)    ED Course/ Medical Decision Making/ A&P                           Medical Decision Making Amount and/or Complexity of Data Reviewed Labs: ordered.  Risk Prescription drug management.   This patient presents to the ED for concern of allergic sx, this involves an extensive number of treatment options, and is a complaint that carries with it a high risk of complications and morbidity.  The differential diagnosis includes anaphylaxis, allergies   Co morbidities that complicate the patient evaluation  anemia, asthma, and seasonal allergies   Additional history obtained:  Additional history obtained from epic chart review  Lab Tests:  I Ordered, and personally interpreted labs.  The pertinent results include:  cbc nl, bmp nl   Cardiac Monitoring:  The patient was maintained on a cardiac monitor.  I personally viewed and interpreted the cardiac monitored which showed an underlying rhythm of: nsr   Medicines ordered and prescription drug management:  I ordered medication including solumedrol  and benadryl  for allergic sx   nebs for sob. Reevaluation of the patient after these medicines showed that the patient improved I have reviewed the patients home medicines and have made adjustments as needed   Critical Interventions:  meds  Problem List / ED Course:  Allergic rxn:  pt is feeling better after solumedrol, benadryl, and duonebs.  She is breathing well.  She is stable for d/c.  She requests an epi pen in case she gets stung again  and sx are worse.  She is given a rx.  She is to return if worse.  F/u with pcp.   Reevaluation:  After the interventions noted above, I reevaluated the patient and found that they have :improved   Social Determinants of Health:  Lives at home   Dispostion:  After consideration of the diagnostic results and the patients response to treatment, I feel that the patent would benefit from discharge with outpatient f/u.          Final Clinical Impression(s) / ED Diagnoses Final diagnoses:  Allergic reaction, initial encounter    Rx / DC Orders ED Discharge Orders          Ordered    predniSONE (DELTASONE) 50 MG tablet  Daily with breakfast        06/12/21 1236    EPINEPHrine 0.3 mg/0.3 mL IJ SOAJ injection  As needed        06/12/21 1236              Isla Pence, MD 06/12/21 1238

## 2021-06-12 NOTE — ED Triage Notes (Signed)
Patient reports to the ER for a bee sting that occurred yesterday. Patient reports she took benadryl and this morning she woke up with swelling to her face and she feels like it is hard to breathe.

## 2021-06-14 ENCOUNTER — Telehealth: Payer: BC Managed Care – PPO | Admitting: Family Medicine

## 2021-06-14 DIAGNOSIS — L03113 Cellulitis of right upper limb: Secondary | ICD-10-CM | POA: Diagnosis not present

## 2021-06-14 DIAGNOSIS — T63441D Toxic effect of venom of bees, accidental (unintentional), subsequent encounter: Secondary | ICD-10-CM

## 2021-06-14 MED ORDER — DOXYCYCLINE HYCLATE 100 MG PO TABS: 100.0000 mg | ORAL_TABLET | Freq: Two times a day (BID) | ORAL | 0 refills | Status: AC

## 2021-06-14 NOTE — Progress Notes (Signed)
Virtual Visit Consent   [name], you are scheduled for a virtual visit with a [facility] provider today. Just as with appointments in the office, your consent must be obtained to participate. Your consent will be active for this visit and any virtual visit you may have with one of our providers in the next 365 days. If you have a MyChart account, a copy of this consent can be sent to you electronically.  As this is a virtual visit, video technology does not allow for your provider to perform a traditional examination. This may limit your provider's ability to fully assess your condition. If your provider identifies any concerns that need to be evaluated in person or the need to arrange testing (such as labs, EKG, etc.), we will make arrangements to do so. Although advances in technology are sophisticated, we cannot ensure that it will always work on either your end or our end. If the connection with a video visit is poor, the visit may have to be switched to a telephone visit. With either a video or telephone visit, we are not always able to ensure that we have a secure connection.  By engaging in this virtual visit, you consent to the provision of healthcare and authorize for your insurance to be billed (if applicable) for the services provided during this visit. Depending on your insurance coverage, you may receive a charge related to this service.  I need to obtain your verbal consent now. Are you willing to proceed with your visit today? Brittney [name] has provided verbal consent on 06/14/2021 for a virtual visit (video or telephone). [name], NP  Date: 06/14/2021 12:02 PM  Virtual Visit via Video Note   I, [name], connected with  Brittney [name]  ([id number], 02-Mar-1970) on 06/14/21 at 12:00 PM EDT by a video-enabled telemedicine application and verified that I am speaking with the correct person using two identifiers.  Location: Patient: Virtual Visit Location  Patient: Home Provider: Virtual Visit Location Provider: Home Office   I discussed the limitations of evaluation and management by telemedicine and the availability of in person appointments. The patient expressed understanding and agreed to proceed.    History of Present Illness: Brittney [name] is a 51 y.o. who identifies as a female who was assigned female at birth, and is being seen today for possible skin infection.  Continues to have redness spreading, warmth and increased tenderness of the bee sting area- the site is much larger than it was. Reports taking Claritin and prednisone as directed. Denies drainage, hard/firm area, fevers or chills.   ED note: stung by bee around 830 pm on Monday.  Bee sting to right tricep that was quarter size at that time. Tuesday woke up swollen with left eye, itchy throat and chest tightness.   Past Medical, Surgical, Social History, Allergies, and Medications have been Reviewed.   Problems:  Patient Active Problem List   Diagnosis Date Noted   Appendicitis 10/20/2020   Acute appendicitis 10/20/2020   COVID-19 virus infection 05/10/2020   Hyperlipidemia 02/24/2020   Obesity (BMI 35.0-39.9 without comorbidity)    Insomnia    Oral herpes    Anemia    Asthma     Allergies:  Allergies  Allergen Reactions   Other     All nuts   Peanut-Containing Drug Products Hives   Sulfa Antibiotics Nausea And Vomiting   Medications:  Current Outpatient Medications:    albuterol (VENTOLIN HFA) 108 (90  Base) MCG/ACT inhaler, Inhale 2 puffs into the lungs every 4 (four) hours as needed for wheezing or shortness of breath., Disp: 8.5 g, Rfl: 0   ALPRAZolam (XANAX) 0.5 MG tablet, Take 1 tablet (0.5 mg total) by mouth daily as needed., Disp: 90 tablet, Rfl: 0   Ascorbic Acid (VITAMIN C PO), Take by mouth daily., Disp: , Rfl:    atorvastatin (LIPITOR) 20 MG tablet, Take 1 tablet (20 mg total) by mouth daily., Disp: 90 tablet, Rfl: 1   EPINEPHrine 0.3 mg/0.3  mL IJ SOAJ injection, Inject 0.3 mg into the muscle as needed for anaphylaxis., Disp: 1 each, Rfl: 0   fluticasone (FLONASE) 50 MCG/ACT nasal spray, Place 2 sprays into both nostrils daily. (Patient taking differently: Place 2 sprays into both nostrils as needed.), Disp: 16 g, Rfl: 0   loratadine (CLARITIN) 10 MG tablet, Take 10 mg by mouth as needed., Disp: , Rfl:    Multiple Vitamin (MULTIVITAMIN) capsule, Take 1 capsule by mouth daily., Disp: , Rfl:    predniSONE (DELTASONE) 50 MG tablet, Take 1 tablet (50 mg total) by mouth daily with breakfast., Disp: 5 tablet, Rfl: 0   Turmeric (QC TUMERIC COMPLEX PO), Take by mouth daily., Disp: , Rfl:    valACYclovir (VALTREX) 1000 MG tablet, TAKE 1 TABLET (1,000 MG TOTAL) BY MOUTH DAILY AS NEEDED., Disp: 90 tablet, Rfl: 1   VITAMIN D PO, Take by mouth daily., Disp: , Rfl:   Observations/Objective: Patient is well-developed, well-nourished in no acute distress.  Resting comfortably  at home.  Head is normocephalic, atraumatic.  No labored breathing.  Speech is clear and coherent with logical content.  Patient is alert and oriented at baseline.  Upper right posterior arm has an area that appears to be 3-4 inches in length and diameter. Central clearing at site of sting.   Assessment and Plan: 1. Bee sting reaction, accidental or unintentional, subsequent encounter  - doxycycline (VIBRA-TABS) 100 MG tablet; Take 1 tablet (100 mg total) by mouth 2 (two) times daily for 7 days.  Dispense: 14 tablet; Refill: 0  2. Cellulitis of right upper extremity  - doxycycline (VIBRA-TABS) 100 MG tablet; Take 1 tablet (100 mg total) by mouth 2 (two) times daily for 7 days.  Dispense: 14 tablet; Refill: 0  S&S are consistent with reported S&S and ED note. Increased area of concern with recent and history of strong skin infection reactions post insect and or bug bite.  Recent Anbx will opt for different class with Doxy.  Pt is out of state in [place], reports will  call pharmacy to send script to them in [facility].    Reviewed side effects, risks and benefits of medication.    Patient acknowledged agreement and understanding of the plan.     Follow Up Instructions: I discussed the assessment and treatment plan with the patient. The patient was provided an opportunity to ask questions and all were answered. The patient agreed with the plan and demonstrated an understanding of the instructions.  A copy of instructions were sent to the patient via MyChart unless otherwise noted below.    The patient was advised to call back or seek an in-person evaluation if the symptoms worsen or if the condition fails to improve as anticipated.  Time:  I spent 15 minutes with the patient via telehealth technology discussing the above problems/concerns.    [name], NP

## 2021-06-14 NOTE — Patient Instructions (Signed)
Cellulitis, Adult  Cellulitis is a skin infection. The infected area is often warm, red, swollen, and sore. It occurs most often in the arms and lower legs. It is very important to get treated for this condition. What are the causes? This condition is caused by bacteria. The bacteria enter through a break in the skin, such as a cut, burn, insect bite, open sore, or crack. What increases the risk? This condition is more likely to occur in people who: Have a weak body defense system (immune system). Have open cuts, burns, bites, or scrapes on the skin. Are older than 52 years of age. Have a blood sugar problem (diabetes). Have a long-lasting (chronic) liver disease (cirrhosis) or kidney disease. Are very overweight (obese). Have a skin problem, such as: Itchy rash (eczema). Slow movement of blood in the veins (venous stasis). Fluid buildup below the skin (edema). Have been treated with high-energy rays (radiation). Use IV drugs. What are the signs or symptoms? Symptoms of this condition include: Skin that is: Red. Streaking. Spotting. Swollen. Sore or painful when you touch it. Warm. A fever. Chills. Blisters. How is this diagnosed? This condition is diagnosed based on: Medical history. Physical exam. Blood tests. Imaging tests. How is this treated? Treatment for this condition may include: Medicines to treat infections or allergies. Home care, such as: Rest. Placing cold or warm cloths (compresses) on the skin. Hospital care, if the condition is very bad. Follow these instructions at home: Medicines Take over-the-counter and prescription medicines only as told by your doctor. If you were prescribed an antibiotic medicine, take it as told by your doctor. Do not stop taking it even if you start to feel better. General instructions  Drink enough fluid to keep your pee (urine) pale yellow. Do not touch or rub the infected area. Raise (elevate) the infected area above  the level of your heart while you are sitting or lying down. Place cold or warm cloths on the area as told by your doctor. Keep all follow-up visits as told by your doctor. This is important. Contact a doctor if: You have a fever. You do not start to get better after 1-2 days of treatment. Your bone or joint under the infected area starts to hurt after the skin has healed. Your infection comes back. This can happen in the same area or another area. You have a swollen bump in the area. You have new symptoms. You feel ill and have muscle aches and pains. Get help right away if: Your symptoms get worse. You feel very sleepy. You throw up (vomit) or have watery poop (diarrhea) for a long time. You see red streaks coming from the area. Your red area gets larger. Your red area turns dark in color. These symptoms may represent a serious problem that is an emergency. Do not wait to see if the symptoms will go away. Get medical help right away. Call your local emergency services (911 in the [place] Do not drive yourself to the hospital. Summary Cellulitis is a skin infection. The area is often warm, red, swollen, and sore. This condition is treated with medicines, rest, and cold and warm cloths. Take all medicines only as told by your doctor. Tell your doctor if symptoms do not start to get better after 1-2 days of treatment. This information is not intended to replace advice given to you by your health care provider. Make sure you discuss any questions you have with your health care provider. Document Revised: 10/05/2020 Document   Reviewed: 10/05/2020 Elsevier Patient Education  2023 [employer] Inc.  

## 2021-08-21 ENCOUNTER — Ambulatory Visit: Payer: BC Managed Care – PPO | Admitting: Dermatology

## 2021-09-03 ENCOUNTER — Telehealth: Payer: BC Managed Care – PPO | Admitting: Physician Assistant

## 2021-09-03 DIAGNOSIS — T63461A Toxic effect of venom of wasps, accidental (unintentional), initial encounter: Secondary | ICD-10-CM | POA: Diagnosis not present

## 2021-09-03 MED ORDER — PREDNISONE 20 MG PO TABS: 40.0000 mg | ORAL_TABLET | Freq: Every day | ORAL | 0 refills | Status: DC

## 2021-09-03 NOTE — Progress Notes (Signed)
I have spent 5 minutes in review of e-visit questionnaire, review and updating patient chart, medical decision making and response to patient.   Donn Zanetti Cody Munachimso Palin, PA-C    

## 2021-09-03 NOTE — Progress Notes (Signed)
E-Visit for Insect Sting  Thank you for describing the insect sting for [place].  Here is how we plan to help!  Based on what you have shared with me, you have a sting that we will treat with a short course of prednisone.  The 2 greatest risks from insect stings are allergic reaction, which can be fatal in some people and infection, which is more common and less serious.  Bees, wasps, yellow jackets, and hornets belong to a class of insects called Hymenoptera.  Most insect stings cause only minor discomfort.  Stings can happen anywhere on the body and can be painful.  Most stings are from honey bees or yellow jackets.  Fire ants can sting multiple times.  The sites of the stings are more likely to become infected.    Based on your information I have:, Provided a home care guide for insect stings and instructions on when to call for help., and I have sent in prednisone 40 mg by mouth daily for 5 days to the pharmacy you selected.  Please make sure that you selected a pharmacy that is open now.  What can be used to prevent Insect Stings?  Insect repellant with at least 20% DEET.  Wearing long pants and shirts with socks and shoes.  Wear dark or drab-colored clothes rather than bright colors.  Avoid using perfumes and hair sprays; these attract insects.  HOME CARE ADVICE:  1. Stinger removal: The stinger looks like a tiny black dot in the sting. Use a fingernail, credit card edge, or knife-edge to scrape it off.  Don't pull it out because it squeezes out more venom. If the stinger is below the skin surface, leave it alone.  It will be shed with normal skin healing. 2. Use cold compresses to the area of the sting for 10-20 minutes.  You may repeat this as needed to relieve symptoms of pain and swelling. 3.  For pain relief, take acetominophen 650 mg 4-6 hours as needed or ibuprofen 400 mg every 6-8 hours as needed or naproxen 250-500 mg every 12 hours as needed. 4.  You can also use  hydrocortisone cream 0.5% or 1% up to 4 times daily as needed for itching. 5.  If the sting becomes very itchy, take Benadryl 25-50 mg, follow directions on box. 6.  Wash the area 2-3 times daily with antibacterial soap and warm water. 7. Call your Doctor if: Fever, a severe headache, or rash occur in the next 2 weeks. Sting area begins to look infected. Redness and swelling worsens after home treatment. Your current symptoms become worse.    MAKE SURE YOU:  Understand these instructions. Will watch your condition. Will get help right away if you are not doing well or get worse.  Thank you for choosing an e-visit.  Your e-visit answers were reviewed by a board certified advanced clinical practitioner to complete your personal care plan. Depending upon the condition, your plan could have included both over the counter or prescription medications.  Please review your pharmacy choice. Make sure the pharmacy is open so you can pick up prescription now. If there is a problem, you may contact your provider through [employer] and have the prescription routed to another pharmacy.  Your safety is important to [place]. If you have drug allergies check your prescription carefully.   For the next 24 hours you can use MyChart to ask questions about today's visit, request a non-urgent call back, or ask for a work or  or school excuse. You will get an email in the next two days asking about your experience. I hope that your e-visit has been valuable and will speed your recovery.  

## 2021-12-06 ENCOUNTER — Telehealth: Payer: BC Managed Care – PPO | Admitting: Physician Assistant

## 2021-12-06 DIAGNOSIS — J019 Acute sinusitis, unspecified: Secondary | ICD-10-CM

## 2021-12-06 DIAGNOSIS — B9789 Other viral agents as the cause of diseases classified elsewhere: Secondary | ICD-10-CM

## 2021-12-06 MED ORDER — PREDNISONE 20 MG PO TABS: 40.0000 mg | ORAL_TABLET | Freq: Every day | ORAL | 0 refills | Status: DC

## 2021-12-06 NOTE — Progress Notes (Signed)
E-Visit for Sinus Problems  We are sorry that you are not feeling well.  Here is how we plan to help!  Based on what you have shared with me it looks like you have sinusitis.  Sinusitis is inflammation and infection in the sinus cavities of the head.  Based on your presentation I believe you most likely have Acute Viral Sinusitis.This is an infection most likely caused by a virus. There is not specific treatment for viral sinusitis other than to help you with the symptoms until the infection runs its course.  You may use an oral decongestant such as Mucinex D or if you have glaucoma or high blood pressure use plain Mucinex. Saline nasal spray help and can safely be used as often as needed for congestion. I have prescribed: prednisone 40 mg once daily for 5 days to reduce sinus inflammation/pain and expedite recovery.   Some authorities believe that zinc sprays or the use of Echinacea may shorten the course of your symptoms.  Sinus infections are not as easily transmitted as other respiratory infection, however we still recommend that you avoid close contact with loved ones, especially the very young and elderly.  Remember to wash your hands thoroughly throughout the day as this is the number one way to prevent the spread of infection!  Home Care: Only take medications as instructed by your medical team. Do not take these medications with alcohol. A steam or ultrasonic humidifier can help congestion.  You can place a towel over your head and breathe in the steam from hot water coming from a faucet. Avoid close contacts especially the very young and the elderly. Cover your mouth when you cough or sneeze. Always remember to wash your hands.  Get Help Right Away If: You develop worsening fever or sinus pain. You develop a severe head ache or visual changes. Your symptoms persist after you have completed your treatment plan.  Make sure you Understand these instructions. Will watch your  condition. Will get help right away if you are not doing well or get worse.   Thank you for choosing an e-visit.  Your e-visit answers were reviewed by a board certified advanced clinical practitioner to complete your personal care plan. Depending upon the condition, your plan could have included both over the counter or prescription medications.  Please review your pharmacy choice. Make sure the pharmacy is open so you can pick up prescription now. If there is a problem, you may contact your provider through [employer] and have the prescription routed to another pharmacy.  Your safety is important to [place]. If you have drug allergies check your prescription carefully.   For the next 24 hours you can use MyChart to ask questions about today's visit, request a non-urgent call back, or ask for a work or school excuse. You will get an email in the next two days asking about your experience. I hope that your e-visit has been valuable and will speed your recovery.

## 2021-12-06 NOTE — Progress Notes (Signed)
I have spent 5 minutes in review of e-visit questionnaire, review and updating patient chart, medical decision making and response to patient.   Kymere Fullington Cody Zaiyah Sottile, PA-C    

## 2021-12-14 ENCOUNTER — Telehealth: Payer: BC Managed Care – PPO | Admitting: Physician Assistant

## 2021-12-14 DIAGNOSIS — B9689 Other specified bacterial agents as the cause of diseases classified elsewhere: Secondary | ICD-10-CM

## 2021-12-14 DIAGNOSIS — J019 Acute sinusitis, unspecified: Secondary | ICD-10-CM

## 2021-12-14 MED ORDER — AMOXICILLIN-POT CLAVULANATE 875-125 MG PO TABS: 1.0000 | ORAL_TABLET | Freq: Two times a day (BID) | ORAL | 0 refills | Status: DC

## 2021-12-14 NOTE — Progress Notes (Signed)

## 2021-12-15 ENCOUNTER — Encounter: Payer: Self-pay | Admitting: Internal Medicine

## 2021-12-15 DIAGNOSIS — B002 Herpesviral gingivostomatitis and pharyngotonsillitis: Secondary | ICD-10-CM

## 2021-12-17 MED ORDER — VALACYCLOVIR HCL 1 G PO TABS: 1000.0000 mg | ORAL_TABLET | Freq: Every day | ORAL | 1 refills | Status: DC | PRN

## 2022-03-14 ENCOUNTER — Ambulatory Visit (INDEPENDENT_AMBULATORY_CARE_PROVIDER_SITE_OTHER): Payer: BC Managed Care – PPO | Admitting: Internal Medicine

## 2022-03-14 ENCOUNTER — Encounter: Payer: Self-pay | Admitting: Internal Medicine

## 2022-03-14 ENCOUNTER — Encounter: Payer: BC Managed Care – PPO | Admitting: Internal Medicine

## 2022-03-14 VITALS — BP 110/80 | HR 82 | Temp 97.7°F | Ht 67.0 in | Wt 237.8 lb

## 2022-03-14 DIAGNOSIS — E669 Obesity, unspecified: Secondary | ICD-10-CM

## 2022-03-14 DIAGNOSIS — Z0000 Encounter for general adult medical examination without abnormal findings: Secondary | ICD-10-CM | POA: Diagnosis not present

## 2022-03-14 DIAGNOSIS — G4700 Insomnia, unspecified: Secondary | ICD-10-CM | POA: Diagnosis not present

## 2022-03-14 DIAGNOSIS — E785 Hyperlipidemia, unspecified: Secondary | ICD-10-CM | POA: Diagnosis not present

## 2022-03-14 DIAGNOSIS — J4520 Mild intermittent asthma, uncomplicated: Secondary | ICD-10-CM

## 2022-03-14 MED ORDER — ALBUTEROL SULFATE HFA 108 (90 BASE) MCG/ACT IN AERS: 2.0000 | INHALATION_SPRAY | RESPIRATORY_TRACT | 0 refills | Status: DC | PRN

## 2022-03-14 MED ORDER — ALPRAZOLAM 0.5 MG PO TABS: 0.5000 mg | ORAL_TABLET | Freq: Every day | ORAL | 0 refills | Status: DC | PRN

## 2022-03-14 NOTE — Progress Notes (Signed)
Established Patient Office Visit     CC/Reason for Visit: Annual preventive exam  HPI: Brittney [name] is a 52 y.o. female who is coming in today for the above mentioned reasons. Past Medical History is significant for: Hyperlipidemia, obesity, asthma, insomnia.  She is feeling well today.  She quit taking atorvastatin about 3 months ago.  She has routine eye and dental care.  She is overdue for Tdap, shingles, COVID vaccination.  Cancer screening is up-to-date.   Past Medical/Surgical History: Past Medical History:  Diagnosis Date   Allergy    seasonal   Anemia    Anxiety    Asthma    Insomnia    Obesity (BMI 35.0-39.9 without comorbidity)    Oral herpes     Past Surgical History:  Procedure Laterality Date   APPENDECTOMY     BILATERAL SALPINGOOPHORECTOMY     ovaries left   CHOLECYSTECTOMY     COLONOSCOPY     LAPAROSCOPIC APPENDECTOMY N/A 10/20/2020   Procedure: APPENDECTOMY LAPAROSCOPIC;  Surgeon: Kinsinger, Arta Bruce, MD;  Location: [facility];  Service: General;  Laterality: N/A;   lapband     TONSILLECTOMY     tummy tuck      Social History:  reports that she has never smoked. She has never used smokeless tobacco. She reports that she does not currently use alcohol. She reports that she does not use drugs.  Allergies: Allergies  Allergen Reactions   Other     All nuts   Peanut-Containing Drug Products Hives   Sulfa Antibiotics Nausea And Vomiting    Family History:  Family History  Problem Relation Age of Onset   Healthy Mother    Cancer Father    Pancreatic cancer Father    Breast cancer Maternal Grandmother    Colon cancer Neg Hx    Colon polyps Neg Hx    Esophageal cancer Neg Hx    Rectal cancer Neg Hx    Stomach cancer Neg Hx      Current Outpatient Medications:    atorvastatin (LIPITOR) 20 MG tablet, Take 1 tablet (20 mg total) by mouth daily., Disp: 90 tablet, Rfl: 1   EPINEPHrine 0.3 mg/0.3 mL IJ SOAJ injection, Inject 0.3 mg into  the muscle as needed for anaphylaxis., Disp: 1 each, Rfl: 0   fluticasone (FLONASE) 50 MCG/ACT nasal spray, Place 2 sprays into both nostrils daily. (Patient taking differently: Place 2 sprays into both nostrils as needed.), Disp: 16 g, Rfl: 0   loratadine (CLARITIN) 10 MG tablet, Take 10 mg by mouth as needed., Disp: , Rfl:    Multiple Vitamin (MULTIVITAMIN) capsule, Take 1 capsule by mouth daily., Disp: , Rfl:    valACYclovir (VALTREX) 1000 MG tablet, Take 1 tablet (1,000 mg total) by mouth daily as needed., Disp: 90 tablet, Rfl: 1   VITAMIN D PO, Take by mouth daily., Disp: , Rfl:    albuterol (VENTOLIN HFA) 108 (90 Base) MCG/ACT inhaler, Inhale 2 puffs into the lungs every 4 (four) hours as needed for wheezing or shortness of breath., Disp: 8.5 g, Rfl: 0   ALPRAZolam (XANAX) 0.5 MG tablet, Take 1 tablet (0.5 mg total) by mouth daily as needed., Disp: 90 tablet, Rfl: 0  Review of Systems:  Negative unless indicated in HPI.   Physical Exam: Vitals:   03/14/22 1438  BP: 110/80  Pulse: 82  Temp: 97.7 F (36.5 C)  TempSrc: Oral  SpO2: 98%  Weight: 237 lb 12.8 oz (107.9 kg)  Height: '5\' 7"'$  (1.702 m)    Body mass index is 37.24 kg/m.   Physical Exam Vitals reviewed.  Constitutional:      General: She is not in acute distress.    Appearance: Normal appearance. She is not ill-appearing, toxic-appearing or diaphoretic.  HENT:     Head: Normocephalic.     Right Ear: Tympanic membrane, ear canal and external ear normal. There is no impacted cerumen.     Left Ear: Tympanic membrane, ear canal and external ear normal. There is no impacted cerumen.     Nose: Nose normal.     Mouth/Throat:     Mouth: Mucous membranes are moist.     Pharynx: Oropharynx is clear. No oropharyngeal exudate or posterior oropharyngeal erythema.  Eyes:     General: No scleral icterus.       Right eye: No discharge.        Left eye: No discharge.     Conjunctiva/sclera: Conjunctivae normal.     Pupils:  Pupils are equal, round, and reactive to light.  Neck:     Vascular: No carotid bruit.  Cardiovascular:     Rate and Rhythm: Normal rate and regular rhythm.     Pulses: Normal pulses.     Heart sounds: Normal heart sounds.  Pulmonary:     Effort: Pulmonary effort is normal. No respiratory distress.     Breath sounds: Normal breath sounds.  Abdominal:     General: Abdomen is flat. Bowel sounds are normal.     Palpations: Abdomen is soft.  Musculoskeletal:        General: Normal range of motion.     Cervical back: Normal range of motion.  Skin:    General: Skin is warm and dry.  Neurological:     General: No focal deficit present.     Mental Status: She is alert and oriented to person, place, and time. Mental status is at baseline.  Psychiatric:        Mood and Affect: Mood normal.        Behavior: Behavior normal.        Thought Content: Thought content normal.        Judgment: Judgment normal.     Flowsheet Row Office Visit from 11/05/2019 in [facility] at [facility]  PHQ-9 Total Score 0        Impression and Plan:  Encounter for preventive health examination  Hyperlipidemia, unspecified hyperlipidemia type - Plan: CBC with Differential/Platelet, Comprehensive metabolic panel, Lipid panel  Insomnia, unspecified type - Plan: TSH, Vitamin B12, Vitamin D, 25-hydroxy, ALPRAZolam (XANAX) 0.5 MG tablet  Obesity (BMI 35.0-39.9 without comorbidity)  Mild intermittent asthma without complication - Plan: albuterol (VENTOLIN HFA) 108 (90 Base) MCG/ACT inhaler  -Recommend routine eye and dental care. -Healthy lifestyle discussed in detail. -Labs to be updated today. -Prostate cancer screening: N/A Health Maintenance  Topic Date Due   HIV Screening  Never done   Hepatitis C Screening: USPSTF Recommendation to screen - Ages 68-79 yo.  Never done   DTaP/Tdap/Td vaccine (1 - Tdap) Never done   COVID-19 Vaccine (3 - 2023-24 season) 09/07/2021   Pap Smear   03/14/2022*   Zoster (Shingles) Vaccine (1 of 2) 06/14/2022*   Mammogram  06/09/2023   Colon Cancer Screening  02/29/2024   Flu Shot  Completed   HPV Vaccine  Aged Out  *Topic was postponed. The date shown is not the original due date.    -Declines Tdap and shingles  today despite counseling. -Obtain records for mammo and Pap last year.      Lelon Frohlich, MD [facility]

## 2022-04-15 ENCOUNTER — Telehealth: Payer: BC Managed Care – PPO | Admitting: Family Medicine

## 2022-04-15 DIAGNOSIS — H9203 Otalgia, bilateral: Secondary | ICD-10-CM

## 2022-04-15 DIAGNOSIS — J029 Acute pharyngitis, unspecified: Secondary | ICD-10-CM | POA: Diagnosis not present

## 2022-04-15 NOTE — Progress Notes (Signed)
E-Visit for Sore Throat  We are sorry that you are not feeling well.  Here is how we plan to help!  Your symptoms indicate a likely viral infection (Pharyngitis).   Pharyngitis is inflammation in the back of the throat which can cause a sore throat, scratchiness and sometimes difficulty swallowing.   Pharyngitis is typically caused by a respiratory virus and will just run its course.  Please keep in mind that your symptoms could last up to 10 days.  For throat pain, we recommend over the counter oral pain relief medications such as acetaminophen or aspirin, or anti-inflammatory medications such as ibuprofen or naproxen sodium.  This will also help with your ear pain.  Switch to Flonase nasal spray to help open your sinuses and drain your ears better as well. Continue saline as needed. Mucinex if you are producing mucus that needs help being removed.   Topical treatments such as oral throat lozenges or sprays may be used as needed.  Avoid close contact with loved ones, especially the very young and elderly.  Remember to wash your hands thoroughly throughout the day as this is the number one way to prevent the spread of infection and wipe down door knobs and counters with disinfectant.    After careful review of your answers, I would not recommend an antibiotic for your condition.  Antibiotics should not be used to treat conditions that we suspect are caused by viruses like the virus that causes the common cold or flu. However, some people can have Strep with atypical symptoms. You may need formal testing in clinic or office to confirm if your symptoms continue or worsen.  Providers prescribe antibiotics to treat infections caused by bacteria. Antibiotics are very powerful in treating bacterial infections when they are used properly.  To maintain their effectiveness, they should be used only when necessary.  Overuse of antibiotics has resulted in the development of super bugs that are resistant to  treatment!    Home Care: Only take medications as instructed by your medical team. Do not drink alcohol while taking these medications. A steam or ultrasonic humidifier can help congestion.  You can place a towel over your head and breathe in the steam from hot water coming from a faucet. Avoid close contacts especially the very young and the elderly. Cover your mouth when you cough or sneeze. Always remember to wash your hands.  Get Help Right Away If: You develop worsening fever or throat pain. You develop a severe head ache or visual changes. Your symptoms persist after you have completed your treatment plan.  Make sure you Understand these instructions. Will watch your condition. Will get help right away if you are not doing well or get worse.   Thank you for choosing an e-visit.  Your e-visit answers were reviewed by a board certified advanced clinical practitioner to complete your personal care plan. Depending upon the condition, your plan could have included both over the counter or prescription medications.  Please review your pharmacy choice. Make sure the pharmacy is open so you can pick up prescription now. If there is a problem, you may contact your provider through [employer] and have the prescription routed to another pharmacy.  Your safety is important to [place]. If you have drug allergies check your prescription carefully.   For the next 24 hours you can use MyChart to ask questions about today's visit, request a non-urgent call back, or ask for a work or school excuse. You will get  an email in the next two days asking about your experience. I hope that your e-visit has been valuable and will speed your recovery.  I provided 5 minutes of non face-to-face time during this encounter for chart review, medication and order placement, as well as and documentation.

## 2022-04-20 ENCOUNTER — Ambulatory Visit
Admission: EM | Admit: 2022-04-20 | Discharge: 2022-04-20 | Disposition: A | Discharge status: Home / Self Care | Payer: BC Managed Care – PPO | Attending: Emergency Medicine | Admitting: Emergency Medicine

## 2022-04-20 DIAGNOSIS — J209 Acute bronchitis, unspecified: Secondary | ICD-10-CM | POA: Diagnosis not present

## 2022-04-20 DIAGNOSIS — H6593 Unspecified nonsuppurative otitis media, bilateral: Secondary | ICD-10-CM | POA: Diagnosis not present

## 2022-04-20 MED ORDER — BENZONATATE 100 MG PO CAPS: 100.0000 mg | ORAL_CAPSULE | Freq: Three times a day (TID) | ORAL | 0 refills | Status: DC

## 2022-04-20 MED ORDER — PREDNISONE 10 MG PO TABS
20.0000 mg | ORAL_TABLET | Freq: Every day | ORAL | 0 refills | Status: AC
Start: 2022-04-20 — End: 2022-04-25

## 2022-04-20 MED ORDER — AZITHROMYCIN 250 MG PO TABS: 250.0000 mg | ORAL_TABLET | Freq: Every day | ORAL | 0 refills | Status: DC

## 2022-04-20 NOTE — Discharge Instructions (Addendum)
Get plenty of rest and push fluids Tessalon Perles prescribed for cough Prescribed azithromycin/take as directed Prednisone was prescribed Continue Flonase for middle ear effusion Use OTC zyrtec for nasal congestion, runny nose, and/or sore throat Use OTC flonase for nasal congestion and runny nose Use medications daily for symptom relief Use OTC medications like ibuprofen or tylenol as needed fever or pain Call or go to the ED if you have any new or worsening symptoms such as fever, worsening cough, shortness of breath, chest tightness, chest pain, turning blue, changes in mental status, etc..Marland Kitchen

## 2022-04-20 NOTE — ED Triage Notes (Signed)
[facility] of otalgia, sore throat , hoarseness, and chest  congestion since Monday. Online dr. Visit on Monday told to take musinex and Flonase. Pt reports recent air travel

## 2022-04-20 NOTE — ED Provider Notes (Signed)
[facility]   [id number] 04/20/22 Arrival Time: 0903   Chief Complaint  Patient presents with   Sore Throat    Entered by patient     SUBJECTIVE: History from: patient.  Brittney [name] is a 52 y.o. female who presented to the urgent care with a complaint of ear pain, nasal congestion, cough for the past 5 days.  Reports she has a husband with the same symptoms.  Reported recent travel.  Has not tried any medication.  Denies any alleviating or aggravating factors.  Denies previous symptoms in the past.   Denies fever, chills, fatigue, sinus pain, rhinorrhea, sore throat,  nausea, changes in bowel or bladder habits.     ROS: As per HPI.  All other pertinent ROS negative.      Past Medical History:  Diagnosis Date   Allergy    seasonal   Anemia    Anxiety    Asthma    Insomnia    Obesity (BMI 35.0-39.9 without comorbidity)    Oral herpes    Past Surgical History:  Procedure Laterality Date   APPENDECTOMY     BILATERAL SALPINGOOPHORECTOMY     ovaries left   CHOLECYSTECTOMY     COLONOSCOPY     LAPAROSCOPIC APPENDECTOMY N/A 10/20/2020   Procedure: APPENDECTOMY LAPAROSCOPIC;  Surgeon: Kinsinger, De Blanch, MD;  Location: [facility];  Service: General;  Laterality: N/A;   lapband     TONSILLECTOMY     tummy tuck     Allergies  Allergen Reactions   Other     All nuts   Peanut-Containing Drug Products Hives   Sulfa Antibiotics Nausea And Vomiting   No current facility-administered medications on file prior to encounter.   Current Outpatient Medications on File Prior to Encounter  Medication Sig Dispense Refill   albuterol (VENTOLIN HFA) 108 (90 Base) MCG/ACT inhaler Inhale 2 puffs into the lungs every 4 (four) hours as needed for wheezing or shortness of breath. 8.5 g 0   ALPRAZolam (XANAX) 0.5 MG tablet Take 1 tablet (0.5 mg total) by mouth daily as needed. 90 tablet 0   atorvastatin (LIPITOR) 20 MG tablet Take 1 tablet (20 mg total) by mouth daily. 90  tablet 1   EPINEPHrine 0.3 mg/0.3 mL IJ SOAJ injection Inject 0.3 mg into the muscle as needed for anaphylaxis. 1 each 0   fluticasone (FLONASE) 50 MCG/ACT nasal spray Place 2 sprays into both nostrils daily. (Patient taking differently: Place 2 sprays into both nostrils as needed.) 16 g 0   loratadine (CLARITIN) 10 MG tablet Take 10 mg by mouth as needed.     Multiple Vitamin (MULTIVITAMIN) capsule Take 1 capsule by mouth daily.     valACYclovir (VALTREX) 1000 MG tablet Take 1 tablet (1,000 mg total) by mouth daily as needed. 90 tablet 1   VITAMIN D PO Take by mouth daily.     Social History   Socioeconomic History   Marital status: Married    Spouse name: Not on file   Number of children: Not on file   Years of education: Not on file   Highest education level: Not on file  Occupational History   Not on file  Tobacco Use   Smoking status: Never   Smokeless tobacco: Never  Vaping Use   Vaping Use: Never used  Substance and Sexual Activity   Alcohol use: Not Currently   Drug use: Never   Sexual activity: Yes  Other Topics Concern   Not on file  Social History Narrative   Not on file   Social Determinants of Health   Financial Resource Strain: Not on file  Food Insecurity: Not on file  Transportation Needs: Not on file  Physical Activity: Not on file  Stress: Not on file  Social Connections: Not on file  Intimate Partner Violence: Not on file   Family History  Problem Relation Age of Onset   Healthy Mother    Cancer Father    Pancreatic cancer Father    Breast cancer Maternal Grandmother    Colon cancer Neg Hx    Colon polyps Neg Hx    Esophageal cancer Neg Hx    Rectal cancer Neg Hx    Stomach cancer Neg Hx     OBJECTIVE:  Vitals:   04/20/22 0923  BP: 117/65  Pulse: 73  Resp: 20  Temp: 98.2 F (36.8 C)  TempSrc: Oral  SpO2: 98%     General appearance: alert; appears fatigued, but nontoxic; speaking in full sentences and tolerating own  secretions HEENT: NCAT; Ears: EACs clear, bilateral TMs with middle ear effusion; Eyes: PERRL.  EOM grossly intact. Sinuses: nontender; Nose: nares patent without rhinorrhea, Throat: oropharynx clear, tonsils non erythematous or enlarged, uvula midline  Neck: supple without LAD Lungs: unlabored respirations, symmetrical air entry; cough: moderate; no respiratory distress; CTAB Heart: regular rate and rhythm.  Radial pulses 2+ symmetrical bilaterally Skin: warm and dry Psychological: alert and cooperative; normal mood and affect  LABS:  No results found for this or any previous visit (from the past 24 hour(s)).   ASSESSMENT & PLAN:  1. Fluid level behind tympanic membrane of both ears   2. Acute bronchitis, unspecified organism     Meds ordered this encounter  Medications   predniSONE (DELTASONE) 10 MG tablet    Sig: Take 2 tablets (20 mg total) by mouth daily for 5 days.    Dispense:  10 tablet    Refill:  0   azithromycin (ZITHROMAX) 250 MG tablet    Sig: Take 1 tablet (250 mg total) by mouth daily. Take first 2 tablets together, then 1 every day until finished.    Dispense:  6 tablet    Refill:  0   benzonatate (TESSALON) 100 MG capsule    Sig: Take 1 capsule (100 mg total) by mouth every 8 (eight) hours.    Dispense:  30 capsule    Refill:  0    Discharge instructions  Get plenty of rest and push fluids Tessalon Perles prescribed for cough Prescribed azithromycin/take as directed Prednisone was prescribed Continue Flonase for middle ear effusion Use OTC zyrtec for nasal congestion, runny nose, and/or sore throat Use OTC flonase for nasal congestion and runny nose Use medications daily for symptom relief Use OTC medications like ibuprofen or tylenol as needed fever or pain Call or go to the ED if you have any new or worsening symptoms such as fever, worsening cough, shortness of breath, chest tightness, chest pain, turning blue, changes in mental status, etc...    Reviewed expectations re: course of current medical issues. Questions answered. Outlined signs and symptoms indicating need for more acute intervention. Patient verbalized understanding. After Visit Summary given.          Durward Parcel, FNP 04/20/22 [phone]

## 2022-04-30 ENCOUNTER — Encounter: Payer: Self-pay | Admitting: Internal Medicine

## 2022-04-30 ENCOUNTER — Other Ambulatory Visit: Payer: Self-pay | Admitting: Internal Medicine

## 2022-04-30 ENCOUNTER — Ambulatory Visit (INDEPENDENT_AMBULATORY_CARE_PROVIDER_SITE_OTHER): Payer: BC Managed Care – PPO | Admitting: Internal Medicine

## 2022-04-30 VITALS — BP 110/80 | HR 73 | Temp 97.5°F | Wt 234.0 lb

## 2022-04-30 DIAGNOSIS — H6993 Unspecified Eustachian tube disorder, bilateral: Secondary | ICD-10-CM

## 2022-04-30 DIAGNOSIS — D509 Iron deficiency anemia, unspecified: Secondary | ICD-10-CM | POA: Diagnosis not present

## 2022-04-30 DIAGNOSIS — G4700 Insomnia, unspecified: Secondary | ICD-10-CM

## 2022-04-30 DIAGNOSIS — E88819 Insulin resistance, unspecified: Secondary | ICD-10-CM | POA: Diagnosis not present

## 2022-04-30 DIAGNOSIS — B3731 Acute candidiasis of vulva and vagina: Secondary | ICD-10-CM | POA: Diagnosis not present

## 2022-04-30 DIAGNOSIS — E785 Hyperlipidemia, unspecified: Secondary | ICD-10-CM

## 2022-04-30 DIAGNOSIS — E782 Mixed hyperlipidemia: Secondary | ICD-10-CM

## 2022-04-30 LAB — CBC WITH DIFFERENTIAL/PLATELET
Basophils Absolute: 0 10*3/uL (ref 0.0–0.1)
Basophils Relative: 0.6 % (ref 0.0–3.0)
Eosinophils Absolute: 0.3 10*3/uL (ref 0.0–0.7)
Eosinophils Relative: 5.2 % — ABNORMAL HIGH (ref 0.0–5.0)
HCT: 42.4 % (ref 36.0–46.0)
Hemoglobin: 14.1 g/dL (ref 12.0–15.0)
Lymphocytes Relative: 32.9 % (ref 12.0–46.0)
Lymphs Abs: 2.2 10*3/uL (ref 0.7–4.0)
MCHC: 33.2 g/dL (ref 30.0–36.0)
MCV: 90.9 fl (ref 78.0–100.0)
Monocytes Absolute: 0.6 10*3/uL (ref 0.1–1.0)
Monocytes Relative: 9.6 % (ref 3.0–12.0)
Neutro Abs: 3.4 10*3/uL (ref 1.4–7.7)
Neutrophils Relative %: 51.7 % (ref 43.0–77.0)
Platelets: 267 10*3/uL (ref 150.0–400.0)
RBC: 4.67 Mil/uL (ref 3.87–5.11)
RDW: 13.7 % (ref 11.5–15.5)
WBC: 6.6 10*3/uL (ref 4.0–10.5)

## 2022-04-30 LAB — COMPREHENSIVE METABOLIC PANEL
ALT: 15 U/L (ref 0–35)
AST: 20 U/L (ref 0–37)
Albumin: 4.5 g/dL (ref 3.5–5.2)
Alkaline Phosphatase: 83 U/L (ref 39–117)
BUN: 17 mg/dL (ref 6–23)
CO2: 30 mEq/L (ref 19–32)
Calcium: 10.1 mg/dL (ref 8.4–10.5)
Chloride: 100 mEq/L (ref 96–112)
Creatinine, Ser: 0.82 mg/dL (ref 0.40–1.20)
GFR: 82.16 mL/min (ref >60.00)
Glucose, Bld: 100 mg/dL — ABNORMAL HIGH (ref 70–99)
Potassium: 4 mEq/L (ref 3.5–5.1)
Sodium: 139 mEq/L (ref 135–145)
Total Bilirubin: 0.8 mg/dL (ref 0.2–1.2)
Total Protein: 7.7 g/dL (ref 6.0–8.3)

## 2022-04-30 LAB — LIPID PANEL
Cholesterol: 241 mg/dL — ABNORMAL HIGH (ref 0–200)
HDL: 54.9 mg/dL (ref >39.00)
NonHDL: 185.68
Total CHOL/HDL Ratio: 4
Triglycerides: 211 mg/dL — ABNORMAL HIGH (ref 0.0–149.0)
VLDL: 42.2 mg/dL — ABNORMAL HIGH (ref 0.0–40.0)

## 2022-04-30 LAB — IBC + FERRITIN
Ferritin: 56.9 ng/mL (ref 10.0–291.0)
Iron: 74 ug/dL (ref 42–145)
Saturation Ratios: 24.4 % (ref 20.0–50.0)
TIBC: 303.8 ug/dL (ref 250.0–450.0)
Transferrin: 217 mg/dL (ref 212.0–360.0)

## 2022-04-30 LAB — LDL CHOLESTEROL, DIRECT: Direct LDL: 172 mg/dL

## 2022-04-30 LAB — VITAMIN B12: Vitamin B-12: 744 pg/mL (ref 211–911)

## 2022-04-30 LAB — TSH: TSH: 1.66 u[IU]/mL (ref 0.35–5.50)

## 2022-04-30 MED ORDER — FLUCONAZOLE 150 MG PO TABS: 150.0000 mg | ORAL_TABLET | Freq: Once | ORAL | 0 refills | Status: AC

## 2022-04-30 MED ORDER — ATORVASTATIN CALCIUM 40 MG PO TABS
40.0000 mg | ORAL_TABLET | Freq: Every day | ORAL | 1 refills | Status: AC
Start: 2022-04-30 — End: ?

## 2022-04-30 NOTE — Progress Notes (Signed)
Established Patient Office Visit     CC/Reason for Visit: Discuss acute concerns  HPI: Brittney [name] is a 52 y.o. female who is coming in today for the above mentioned reasons.  She is here today to discuss persistent ear pain, congestion and postnasal drip.  On April 13 she went to urgent care and was treated with prednisone, azithromycin and Tessalon Perles.  She had some improvement but not complete.  She has had several flights in the interim and has noticed significant ear pain with  pressure changes.   Past Medical/Surgical History: Past Medical History:  Diagnosis Date   Allergy    seasonal   Anemia    Anxiety    Asthma    Insomnia    Obesity (BMI 35.0-39.9 without comorbidity)    Oral herpes     Past Surgical History:  Procedure Laterality Date   APPENDECTOMY     BILATERAL SALPINGOOPHORECTOMY     ovaries left   CHOLECYSTECTOMY     COLONOSCOPY     LAPAROSCOPIC APPENDECTOMY N/A 10/20/2020   Procedure: APPENDECTOMY LAPAROSCOPIC;  Surgeon: Kinsinger, De Blanch, MD;  Location: [facility] OR;  Service: General;  Laterality: N/A;   lapband     TONSILLECTOMY     tummy tuck      Social History:  reports that she has never smoked. She has never used smokeless tobacco. She reports that she does not currently use alcohol. She reports that she does not use drugs.  Allergies: Allergies  Allergen Reactions   Other     All nuts   Peanut-Containing Drug Products Hives   Sulfa Antibiotics Nausea And Vomiting    Family History:  Family History  Problem Relation Age of Onset   Healthy Mother    Cancer Father    Pancreatic cancer Father    Breast cancer Maternal Grandmother    Colon cancer Neg Hx    Colon polyps Neg Hx    Esophageal cancer Neg Hx    Rectal cancer Neg Hx    Stomach cancer Neg Hx      Current Outpatient Medications:    albuterol (VENTOLIN HFA) 108 (90 Base) MCG/ACT inhaler, Inhale 2 puffs into the lungs every 4 (four) hours as needed for  wheezing or shortness of breath., Disp: 8.5 g, Rfl: 0   ALPRAZolam (XANAX) 0.5 MG tablet, Take 1 tablet (0.5 mg total) by mouth daily as needed., Disp: 90 tablet, Rfl: 0   atorvastatin (LIPITOR) 20 MG tablet, Take 1 tablet (20 mg total) by mouth daily., Disp: 90 tablet, Rfl: 1   EPINEPHrine 0.3 mg/0.3 mL IJ SOAJ injection, Inject 0.3 mg into the muscle as needed for anaphylaxis., Disp: 1 each, Rfl: 0   fluconazole (DIFLUCAN) 150 MG tablet, Take 1 tablet (150 mg total) by mouth once for 1 dose., Disp: 1 tablet, Rfl: 0   fluticasone (FLONASE) 50 MCG/ACT nasal spray, Place 2 sprays into both nostrils daily. (Patient taking differently: Place 2 sprays into both nostrils as needed.), Disp: 16 g, Rfl: 0   loratadine (CLARITIN) 10 MG tablet, Take 10 mg by mouth as needed., Disp: , Rfl:    Multiple Vitamin (MULTIVITAMIN) capsule, Take 1 capsule by mouth daily., Disp: , Rfl:    valACYclovir (VALTREX) 1000 MG tablet, Take 1 tablet (1,000 mg total) by mouth daily as needed., Disp: 90 tablet, Rfl: 1   VITAMIN D PO, Take by mouth daily., Disp: , Rfl:   Review of Systems:  Negative unless indicated in  HPI.   Physical Exam: Vitals:   04/30/22 0737  BP: 110/80  Pulse: 73  Temp: (!) 97.5 F (36.4 C)  TempSrc: Oral  SpO2: 98%  Weight: 234 lb (106.1 kg)    Body mass index is 36.65 kg/m.   Physical Exam Vitals reviewed.  Constitutional:      Appearance: Normal appearance.  HENT:     Right Ear: Tympanic membrane, ear canal and external ear normal.     Left Ear: Tympanic membrane, ear canal and external ear normal.     Mouth/Throat:     Mouth: Mucous membranes are moist.     Pharynx: Posterior oropharyngeal erythema present.  Eyes:     Conjunctiva/sclera: Conjunctivae normal.     Pupils: Pupils are equal, round, and reactive to light.  Cardiovascular:     Rate and Rhythm: Normal rate and regular rhythm.  Pulmonary:     Effort: Pulmonary effort is normal.     Breath sounds: Normal breath  sounds.  Neurological:     Mental Status: She is alert.      Impression and Plan:  Dysfunction of both eustachian tubes  Vaginal yeast infection - Plan: fluconazole (DIFLUCAN) 150 MG tablet  Insulin resistance - Plan: Insulin, Free (Bioactive)  Iron deficiency anemia, unspecified iron deficiency anemia type - Plan: IBC + Ferritin  Insomnia, unspecified type - Plan: Vitamin B12, TSH  Hyperlipidemia, unspecified hyperlipidemia type - Plan: Lipid panel, Comprehensive metabolic panel, CBC with Differential/Platelet  -Suspect seasonal allergies plus it was staking tube dysfunction more so than a URI/ear infection.  She was recently treated with prednisone azithromycin and Tessalon Perles.  Have advised daily use of Flonase, Mucinex and an antihistamine. -She is dealing with a vaginal yeast infection after recent antibiotic course, fluconazole will be sent. -She will be having her physical labs today that are pending from last month.  She has a history of insulin resistance and is asking that we add fasting insulin to her labs.  Time spent:32 minutes reviewing chart, interviewing and examining patient and formulating plan of care.     Chaya Jan, MD [facility] at [facility]

## 2022-05-02 ENCOUNTER — Encounter: Payer: Self-pay | Admitting: Internal Medicine

## 2022-05-02 DIAGNOSIS — E88819 Insulin resistance, unspecified: Secondary | ICD-10-CM

## 2022-05-02 DIAGNOSIS — D509 Iron deficiency anemia, unspecified: Secondary | ICD-10-CM

## 2022-05-02 DIAGNOSIS — Z0000 Encounter for general adult medical examination without abnormal findings: Secondary | ICD-10-CM

## 2022-05-08 LAB — INSULIN, FREE (BIOACTIVE): Insulin, Free: 8 u[IU]/mL (ref 1.5–14.9)

## 2022-05-16 ENCOUNTER — Telehealth: Payer: BC Managed Care – PPO | Admitting: Physician Assistant

## 2022-05-16 DIAGNOSIS — L0292 Furuncle, unspecified: Secondary | ICD-10-CM

## 2022-05-16 MED ORDER — DOXYCYCLINE HYCLATE 100 MG PO TABS
100.0000 mg | ORAL_TABLET | Freq: Two times a day (BID) | ORAL | 0 refills | Status: DC
Start: 2022-05-16 — End: 2022-12-17
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
## 2022-05-16 NOTE — Patient Instructions (Signed)
  Coralie Carpen, thank you for joining [name], PA-C for today's virtual visit.  While this provider is not your primary care provider (PCP), if your PCP is located in our provider database this encounter information will be shared with them immediately following your visit.   A [facility] MyChart account gives you access to today's visit and all your visits, tests, and labs performed at [facility] " click here if you don't have a [facility] MyChart account or go to [email or web address]  Consent: (Patient) Brittney [name] provided verbal consent for this virtual visit at the beginning of the encounter.  Current Medications:  Current Outpatient Medications:    albuterol (VENTOLIN HFA) 108 (90 Base) MCG/ACT inhaler, Inhale 2 puffs into the lungs every 4 (four) hours as needed for wheezing or shortness of breath., Disp: 8.5 g, Rfl: 0   ALPRAZolam (XANAX) 0.5 MG tablet, Take 1 tablet (0.5 mg total) by mouth daily as needed., Disp: 90 tablet, Rfl: 0   atorvastatin (LIPITOR) 40 MG tablet, Take 1 tablet (40 mg total) by mouth daily., Disp: 90 tablet, Rfl: 1   EPINEPHrine 0.3 mg/0.3 mL IJ SOAJ injection, Inject 0.3 mg into the muscle as needed for anaphylaxis., Disp: 1 each, Rfl: 0   fluticasone (FLONASE) 50 MCG/ACT nasal spray, Place 2 sprays into both nostrils daily. (Patient taking differently: Place 2 sprays into both nostrils as needed.), Disp: 16 g, Rfl: 0   loratadine (CLARITIN) 10 MG tablet, Take 10 mg by mouth as needed., Disp: , Rfl:    Multiple Vitamin (MULTIVITAMIN) capsule, Take 1 capsule by mouth daily., Disp: , Rfl:    valACYclovir (VALTREX) 1000 MG tablet, Take 1 tablet (1,000 mg total) by mouth daily as needed., Disp: 90 tablet, Rfl: 1   VITAMIN D PO, Take by mouth daily., Disp: , Rfl:    Medications ordered in this encounter:  No orders of the defined types were placed in this encounter.    *If you need refills on other medications prior to  your next appointment, please contact your pharmacy*  Follow-Up: Call back or seek an in-person evaluation if the symptoms worsen or if the condition fails to improve as anticipated.  [facility] [phone]  Other Instructions As discussed, please keep skin clean and dry. Apply warm compresses to the area for 10 minutes a few times per day. Take the Doxycycline as directed. If symptoms are not improving over next 48 hours or you note any worsening symptoms, you need to be evaluated in person. Please do not delay care.    If you have been instructed to have an in-person evaluation today at a local Urgent Care facility, please use the link below. It will take you to a list of all of our available [facility], including address, phone number and hours of operation. Please do not delay care.  [facility]  If you or a family member do not have a primary care provider, use the link below to schedule a visit and establish care. When you choose a [facility] primary care physician or advanced practice provider, you gain a long-term partner in health. Find a Primary Care Provider  Learn more about [facility]'s in-office and virtual care options:  - Get Care Now

## 2022-05-20 ENCOUNTER — Other Ambulatory Visit: Payer: Self-pay | Admitting: Obstetrics and Gynecology

## 2022-05-20 DIAGNOSIS — Z1231 Encounter for screening mammogram for malignant neoplasm of breast: Secondary | ICD-10-CM

## 2022-06-11 ENCOUNTER — Ambulatory Visit
Admission: RE | Admit: 2022-06-11 | Discharge: 2022-06-11 | Disposition: A | Discharge status: Home / Self Care | Payer: BC Managed Care – PPO | Source: Ambulatory Visit | Attending: Obstetrics and Gynecology | Admitting: Obstetrics and Gynecology

## 2022-06-11 DIAGNOSIS — Z1231 Encounter for screening mammogram for malignant neoplasm of breast: Secondary | ICD-10-CM | POA: Diagnosis present

## 2022-07-08 ENCOUNTER — Telehealth: Payer: BC Managed Care – PPO | Admitting: Family Medicine

## 2022-07-08 DIAGNOSIS — R21 Rash and other nonspecific skin eruption: Secondary | ICD-10-CM | POA: Diagnosis not present

## 2022-07-08 MED ORDER — TRIAMCINOLONE ACETONIDE 0.025 % EX OINT
1.0000 | TOPICAL_OINTMENT | Freq: Two times a day (BID) | CUTANEOUS | 0 refills | Status: DC
Start: 2022-07-08 — End: 2023-01-17

## 2022-07-08 NOTE — Progress Notes (Signed)
E Visit for Rash  We are sorry that you are not feeling well. Here is how we plan to help!  Based on what you shared with me it looks like you have contact dermatitis.  Contact dermatitis is a skin rash caused by something that touches the skin and causes irritation or inflammation.  Your skin may be red, swollen, dry, cracked, and itch.  The rash should go away in a few days but can last a few weeks.  If you get a rash, it's important to figure out what caused it so the irritant can be avoided in the future.  I will order a stronger steroid cream since the area as overall small and not wide spread. Follow up in person if this does not improve it. Kenalog twice daily.    HOME CARE:  Take cool showers and avoid direct sunlight. Apply cool compress or wet dressings. Take a bath in an oatmeal bath.  Sprinkle content of one Aveeno packet under running faucet with comfortably warm water.  Bathe for 15-20 minutes, 1-2 times daily.  Pat dry with a towel. Do not rub the rash. Use hydrocortisone cream. Take an antihistamine like Benadryl for widespread rashes that itch.  The adult dose of Benadryl is 25-50 mg by mouth 4 times daily. Caution:  This type of medication may cause sleepiness.  Do not drink alcohol, drive, or operate dangerous machinery while taking antihistamines.  Do not take these medications if you have prostate enlargement.  Read package instructions thoroughly on all medications that you take.  GET HELP RIGHT AWAY IF:  Symptoms don't go away after treatment. Severe itching that persists. If you rash spreads or swells. If you rash begins to smell. If it blisters and opens or develops a yellow-brown crust. You develop a fever. You have a sore throat. You become short of breath.  MAKE SURE YOU:  Understand these instructions. Will watch your condition. Will get help right away if you are not doing well or get worse.  Thank you for choosing an e-visit.  Your e-visit answers  were reviewed by a board certified advanced clinical practitioner to complete your personal care plan. Depending upon the condition, your plan could have included both over the counter or prescription medications.  Please review your pharmacy choice. Make sure the pharmacy is open so you can pick up prescription now. If there is a problem, you may contact your provider through [employer] and have the prescription routed to another pharmacy.  Your safety is important to [place]. If you have drug allergies check your prescription carefully.   For the next 24 hours you can use MyChart to ask questions about today's visit, request a non-urgent call back, or ask for a work or school excuse. You will get an email in the next two days asking about your experience. I hope that your e-visit has been valuable and will speed your recovery.  I provided 5 minutes of non face-to-face time during this encounter for chart review, medication and order placement, as well as and documentation.

## 2022-07-18 ENCOUNTER — Ambulatory Visit (INDEPENDENT_AMBULATORY_CARE_PROVIDER_SITE_OTHER): Payer: BC Managed Care – PPO | Admitting: *Deleted

## 2022-07-18 DIAGNOSIS — Z23 Encounter for immunization: Secondary | ICD-10-CM | POA: Diagnosis not present

## 2022-07-23 ENCOUNTER — Other Ambulatory Visit: Payer: Self-pay

## 2022-07-23 ENCOUNTER — Emergency Department (HOSPITAL_BASED_OUTPATIENT_CLINIC_OR_DEPARTMENT_OTHER): Payer: BC Managed Care – PPO

## 2022-07-23 ENCOUNTER — Emergency Department (HOSPITAL_BASED_OUTPATIENT_CLINIC_OR_DEPARTMENT_OTHER)
Admission: EM | Admit: 2022-07-23 | Discharge: 2022-07-24 | Disposition: A | Discharge status: Home / Self Care | Payer: BC Managed Care – PPO | Attending: Emergency Medicine | Admitting: Emergency Medicine

## 2022-07-23 DIAGNOSIS — W010XXA Fall on same level from slipping, tripping and stumbling without subsequent striking against object, initial encounter: Secondary | ICD-10-CM | POA: Diagnosis not present

## 2022-07-23 DIAGNOSIS — M542 Cervicalgia: Secondary | ICD-10-CM | POA: Diagnosis not present

## 2022-07-23 DIAGNOSIS — S0990XA Unspecified injury of head, initial encounter: Secondary | ICD-10-CM | POA: Insufficient documentation

## 2022-07-23 DIAGNOSIS — Z91010 Allergy to peanuts: Secondary | ICD-10-CM | POA: Diagnosis not present
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
## 2022-07-24 MED ORDER — CYCLOBENZAPRINE HCL 10 MG PO TABS: 10.0000 mg | ORAL_TABLET | Freq: Two times a day (BID) | ORAL | 0 refills | Status: AC | PRN

## 2022-07-24 MED ORDER — OXYCODONE-ACETAMINOPHEN 5-325 MG PO TABS: 1.0000 | ORAL_TABLET | Freq: Three times a day (TID) | ORAL | 0 refills | Status: DC | PRN

## 2022-07-24 NOTE — ED Provider Notes (Signed)
[facility] AT [facility] Provider Note   CSN: [id number] Arrival date & time: 07/23/22  1934     History  Chief Complaint  Patient presents with   [facility] Injury    Brittney [name] is a 52 y.o. female.  52 year old female who presents the ER today secondary to persistent headache.  On Monday she was loading up her F350 and when stepping up to the tailgate caught her foot, slipped and fell backwards landing on her coccyx and then rolling back and hit the back of her head on the ground.  No loss of consciousness.  Pretty persistent headache not relieved by OTC medications since that time.  No nausea, vomiting, vision changes or other associated neurologic changes.  Also has some bilateral neck pain.   Fall  Head Injury      Home Medications Prior to Admission medications   Medication Sig Start Date End Date Taking? Authorizing Provider  cyclobenzaprine (FLEXERIL) 10 MG tablet Take 1 tablet (10 mg total) by mouth 2 (two) times daily as needed for muscle spasms. 07/24/22  Yes Shaune Westfall, Barbara Cower, MD  oxyCODONE-acetaminophen (PERCOCET) 5-325 MG tablet Take 1-2 tablets by mouth every 8 (eight) hours as needed. 07/24/22  Yes Makhai Fulco, Barbara Cower, MD  albuterol (VENTOLIN HFA) 108 (90 Base) MCG/ACT inhaler Inhale 2 puffs into the lungs every 4 (four) hours as needed for wheezing or shortness of breath. 03/14/22   Philip Aspen, Limmie Patricia, MD  ALPRAZolam Prudy Feeler) 0.5 MG tablet Take 1 tablet (0.5 mg total) by mouth daily as needed. 03/14/22   Philip Aspen, Limmie Patricia, MD  atorvastatin (LIPITOR) 40 MG tablet Take 1 tablet (40 mg total) by mouth daily. 04/30/22   Philip Aspen, Limmie Patricia, MD  doxycycline (VIBRA-TABS) 100 MG tablet Take 1 tablet (100 mg total) by mouth 2 (two) times daily. 05/16/22   Waldon Merl, PA-C  EPINEPHrine 0.3 mg/0.3 mL IJ SOAJ injection Inject 0.3 mg into the muscle as needed for anaphylaxis. 06/12/21   Jacalyn Lefevre, MD  fluticasone  (FLONASE) 50 MCG/ACT nasal spray Place 2 sprays into both nostrils daily. Patient taking differently: Place 2 sprays into both nostrils as needed. 12/19/20   Waldon Merl, PA-C  loratadine (CLARITIN) 10 MG tablet Take 10 mg by mouth as needed.    [provider]  Multiple Vitamin (MULTIVITAMIN) capsule Take 1 capsule by mouth daily.    [provider]  triamcinolone (KENALOG) 0.025 % ointment Apply 1 Application topically 2 (two) times daily. 07/08/22   Freddy Finner, NP  valACYclovir (VALTREX) 1000 MG tablet Take 1 tablet (1,000 mg total) by mouth daily as needed. 12/17/21   Philip Aspen, Limmie Patricia, MD  VITAMIN D PO Take by mouth daily.    [provider]      Allergies    Other, Peanut-containing drug products, and Sulfa antibiotics    Review of Systems   Review of Systems  Physical Exam Updated Vital Signs BP 124/74 (BP Location: Right Arm)   Pulse 63   Temp 98.3 F (36.8 C) (Oral)   Resp 19   Ht 5\' 7"  (1.702 m)   Wt 102.1 kg   SpO2 100%   BMI 35.24 kg/m  Physical Exam Vitals and nursing note reviewed.  Constitutional:      Appearance: She is well-developed.  HENT:     Head: Normocephalic and atraumatic.  Eyes:     Pupils: Pupils are equal, round, and reactive to light.  Cardiovascular:     Rate and Rhythm: Normal rate and regular rhythm.  Pulmonary:     Effort: No respiratory distress.     Breath sounds: No stridor.  Abdominal:     General: There is no distension.  Musculoskeletal:        General: No swelling or deformity.     Cervical back: Normal range of motion.     Comments: Bilateral paracervical tenderness  Skin:    General: Skin is warm and dry.  Neurological:     Mental Status: She is alert.     ED Results / Procedures / Treatments   Labs (all labs ordered are listed, but only abnormal results are displayed) Labs Reviewed - No data to display  EKG None  Radiology CT Head Wo Contrast  Result Date:  07/23/2022 CLINICAL DATA:  Head and neck trauma. Climbing into pickup truck, fell backwards. Struck posterior and then head. Headache and neck tendinous. EXAM: CT HEAD WITHOUT CONTRAST CT CERVICAL SPINE WITHOUT CONTRAST TECHNIQUE: Multidetector CT imaging of the head and cervical spine was performed following the standard protocol without intravenous contrast. Multiplanar CT image reconstructions of the cervical spine were also generated. RADIATION DOSE REDUCTION: This exam was performed according to the departmental dose-optimization program which includes automated exposure control, adjustment of the mA and/or kV according to patient size and/or use of iterative reconstruction technique. COMPARISON:  None Available. FINDINGS: CT HEAD FINDINGS Brain: No evidence of acute infarction, hemorrhage, hydrocephalus, extra-axial collection or mass lesion/mass effect. Vascular: No hyperdense vessel or unexpected calcification. Skull: Normal. Negative for fracture or focal lesion. Sinuses/Orbits: No acute finding. Other: None. CT CERVICAL SPINE FINDINGS Alignment: Normal. Skull base and vertebrae: No acute fracture. No primary bone lesion or focal pathologic process. Soft tissues and spinal canal: No prevertebral fluid or swelling. No visible canal hematoma. Disc levels: No significant disc bulge, spinal canal or neural foraminal stenosis. Upper chest: Negative. Other: None IMPRESSION: CT HEAD: No acute intracranial abnormality. CT CERVICAL SPINE: No acute fracture or traumatic subluxation. Electronically Signed   By: Larose Hires D.O.   On: 07/23/2022 23:31   CT Cervical Spine Wo Contrast  Result Date: 07/23/2022 CLINICAL DATA:  Head and neck trauma. Climbing into pickup truck, fell backwards. Struck posterior and then head. Headache and neck tendinous. EXAM: CT HEAD WITHOUT CONTRAST CT CERVICAL SPINE WITHOUT CONTRAST TECHNIQUE: Multidetector CT imaging of the head and cervical spine was performed following the standard  protocol without intravenous contrast. Multiplanar CT image reconstructions of the cervical spine were also generated. RADIATION DOSE REDUCTION: This exam was performed according to the departmental dose-optimization program which includes automated exposure control, adjustment of the mA and/or kV according to patient size and/or use of iterative reconstruction technique. COMPARISON:  None Available. FINDINGS: CT HEAD FINDINGS Brain: No evidence of acute infarction, hemorrhage, hydrocephalus, extra-axial collection or mass lesion/mass effect. Vascular: No hyperdense vessel or unexpected calcification. Skull: Normal. Negative for fracture or focal lesion. Sinuses/Orbits: No acute finding. Other: None. CT CERVICAL SPINE FINDINGS Alignment: Normal. Skull base and vertebrae: No acute fracture. No primary bone lesion or focal pathologic process. Soft tissues and spinal canal: No prevertebral fluid or swelling. No visible canal hematoma. Disc levels: No significant disc bulge, spinal canal or neural foraminal stenosis. Upper chest: Negative. Other: None IMPRESSION: CT HEAD: No acute intracranial abnormality. CT CERVICAL SPINE: No acute fracture or traumatic subluxation. Electronically Signed   By: Larose Hires D.O.   On: 07/23/2022 23:31    Procedures Procedures  Medications Ordered in ED Medications - No data to display  ED Course/ Medical Decision Making/ A&P                             Medical Decision Making Amount and/or Complexity of Data Reviewed Radiology: ordered.  Risk Prescription drug management.   CT head/neck done to eval for traumatic injury and on my interpretataion/review does not show fracture, bleed or e/o instability. Suspect likely MSK pain and possibly mild concussion. Will treat symptomatically for same. PCP follow up as needed if not improving, here if worsening.   Final Clinical Impression(s) / ED Diagnoses Final diagnoses:  Traumatic injury of head, initial encounter     Rx / DC Orders ED Discharge Orders          Ordered    oxyCODONE-acetaminophen (PERCOCET) 5-325 MG tablet  Every 8 hours PRN        07/24/22 0001    cyclobenzaprine (FLEXERIL) 10 MG tablet  2 times daily PRN        07/24/22 0001              Gradie Ohm, Barbara Cower, MD 07/24/22 0451

## 2022-07-24 NOTE — ED Notes (Signed)
Pt verbalized understanding of d/c instructions, meds, and followup care. Denies questions. VSS, no distress noted. Steady gait to exit with all belongings.  ?

## 2022-10-28 ENCOUNTER — Other Ambulatory Visit: Payer: BC Managed Care – PPO

## 2022-10-28 ENCOUNTER — Ambulatory Visit: Payer: BC Managed Care – PPO

## 2022-10-29 ENCOUNTER — Ambulatory Visit (INDEPENDENT_AMBULATORY_CARE_PROVIDER_SITE_OTHER): Payer: BC Managed Care – PPO | Admitting: *Deleted

## 2022-10-29 ENCOUNTER — Other Ambulatory Visit: Payer: BC Managed Care – PPO

## 2022-10-29 ENCOUNTER — Ambulatory Visit: Payer: BC Managed Care – PPO

## 2022-10-29 DIAGNOSIS — Z23 Encounter for immunization: Secondary | ICD-10-CM

## 2022-11-20 ENCOUNTER — Encounter: Payer: Self-pay | Admitting: Internal Medicine

## 2022-11-20 ENCOUNTER — Ambulatory Visit: Payer: BC Managed Care – PPO | Admitting: Internal Medicine

## 2022-11-20 VITALS — BP 110/80 | HR 79 | Temp 97.9°F | Wt 230.9 lb

## 2022-11-20 DIAGNOSIS — F419 Anxiety disorder, unspecified: Secondary | ICD-10-CM

## 2022-11-20 DIAGNOSIS — S76311A Strain of muscle, fascia and tendon of the posterior muscle group at thigh level, right thigh, initial encounter: Secondary | ICD-10-CM | POA: Diagnosis not present

## 2022-11-21 ENCOUNTER — Encounter: Payer: Self-pay | Admitting: Internal Medicine

## 2022-11-21 MED ORDER — MELOXICAM 7.5 MG PO TABS: 7.5000 mg | ORAL_TABLET | Freq: Every day | ORAL | 0 refills | Status: DC

## 2022-11-21 NOTE — Telephone Encounter (Signed)
Left message on machine for patient to return our call 

## 2022-11-21 NOTE — Progress Notes (Signed)
Established Patient Office Visit     CC/Reason for Visit: Discuss thigh pain and anxiety.  HPI: Brittney [name] is a 52 y.o. female who is coming in today for the above mentioned reasons.  For a couple weeks has been experiencing right thigh pain.  Pain is located to the posterior and inside of her thigh.  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  She does not recall a particular incident that caused her pain.  This thigh pain has caused extreme anxiety.  She is very tearful today.     Past Medical/Surgical History: Past Medical History:  Diagnosis Date   Allergy    seasonal   Anemia    Anxiety    Asthma    Insomnia    Obesity (BMI 35.0-39.9 without comorbidity)    Oral herpes     Past Surgical History:  Procedure Laterality Date   APPENDECTOMY     BILATERAL SALPINGOOPHORECTOMY     ovaries left   CHOLECYSTECTOMY     COLONOSCOPY     LAPAROSCOPIC APPENDECTOMY N/A 10/20/2020   Procedure: APPENDECTOMY LAPAROSCOPIC;  Surgeon: Kinsinger, De Blanch, MD;  Location: [facility] OR;  Service: General;  Laterality: N/A;   lapband     TONSILLECTOMY     tummy tuck      Social History:  reports that she has never smoked. She has never used smokeless tobacco. She reports that she does not currently use alcohol. She reports that she does not use drugs.  Allergies: Allergies  Allergen Reactions   Other     All nuts   Peanut-Containing Drug Products Hives   Sulfa Antibiotics Nausea And Vomiting    Family History:  Family History  Problem Relation Age of Onset   Healthy Mother    Cancer Father    Pancreatic cancer Father    Breast cancer Maternal Grandmother    Colon cancer Neg Hx    Colon polyps Neg Hx    Esophageal cancer Neg Hx    Rectal cancer Neg Hx    Stomach cancer Neg Hx      Current Outpatient Medications:    albuterol (VENTOLIN HFA) 108 (90 Base) MCG/ACT inhaler, Inhale 2 puffs into the lungs every 4 (four) hours as needed for wheezing  or shortness of breath., Disp: 8.5 g, Rfl: 0   ALPRAZolam (XANAX) 0.5 MG tablet, Take 1 tablet (0.5 mg total) by mouth daily as needed., Disp: 90 tablet, Rfl: 0   atorvastatin (LIPITOR) 40 MG tablet, Take 1 tablet (40 mg total) by mouth daily., Disp: 90 tablet, Rfl: 1   cyclobenzaprine (FLEXERIL) 10 MG tablet, Take 1 tablet (10 mg total) by mouth 2 (two) times daily as needed for muscle spasms., Disp: 20 tablet, Rfl: 0   doxycycline (VIBRA-TABS) 100 MG tablet, Take 1 tablet (100 mg total) by mouth 2 (two) times daily., Disp: 14 tablet, Rfl: 0   EPINEPHrine 0.3 mg/0.3 mL IJ SOAJ injection, Inject 0.3 mg into the muscle as needed for anaphylaxis., Disp: 1 each, Rfl: 0   fluticasone (FLONASE) 50 MCG/ACT nasal spray, Place 2 sprays into both nostrils daily. (Patient taking differently: Place 2 sprays into both nostrils as needed.), Disp: 16 g, Rfl: 0   loratadine (CLARITIN) 10 MG tablet, Take 10 mg by mouth as needed., Disp: , Rfl:    meloxicam (MOBIC) 7.5 MG tablet, Take 1 tablet (7.5 mg total) by mouth daily., Disp: 30 tablet, Rfl: 0   Multiple  Vitamin (MULTIVITAMIN) capsule, Take 1 capsule by mouth daily., Disp: , Rfl:    oxyCODONE-acetaminophen (PERCOCET) 5-325 MG tablet, Take 1-2 tablets by mouth every 8 (eight) hours as needed., Disp: 5 tablet, Rfl: 0   triamcinolone (KENALOG) 0.025 % ointment, Apply 1 Application topically 2 (two) times daily., Disp: 30 g, Rfl: 0   valACYclovir (VALTREX) 1000 MG tablet, Take 1 tablet (1,000 mg total) by mouth daily as needed., Disp: 90 tablet, Rfl: 1   VITAMIN D PO, Take by mouth daily., Disp: , Rfl:   Review of Systems:  Negative unless indicated in HPI.   Physical Exam: Vitals:   11/20/22 0828  BP: 110/80  Pulse: 79  Temp: 97.9 F (36.6 C)  TempSrc: Oral  SpO2: 100%  Weight: 230 lb 14.4 oz (104.7 kg)    Body mass index is 36.16 kg/m.   Physical Exam Musculoskeletal:     Comments: Hamstring muscle tightness on right     Flowsheet Row  Office Visit from 11/20/2022 in [facility] [place]  PHQ-9 Total Score 8       Impression and Plan:  Anxiety  Hamstring strain, right, initial encounter -     Meloxicam; Take 1 tablet (7.5 mg total) by mouth daily.  Dispense: 30 tablet; Refill: 0  -I believe her thigh pain is a hamstring strain, have advised 10-day course of meloxicam as well as stretches. -We discussed management of her anxiety.  She elects to try CBT first and will consider medication at a later date if no improvement.   Time spent:30 minutes reviewing chart, interviewing and examining patient and formulating plan of care.     Chaya Jan, MD [facility]

## 2022-11-28 NOTE — Telephone Encounter (Signed)
Left message on machine for patient to return our call 

## 2022-12-02 NOTE — Telephone Encounter (Signed)
Spoke to the patient and she states that "everything is okay".

## 2022-12-17 ENCOUNTER — Telehealth: Payer: BC Managed Care – PPO | Admitting: Physician Assistant

## 2022-12-17 DIAGNOSIS — J4521 Mild intermittent asthma with (acute) exacerbation: Secondary | ICD-10-CM

## 2022-12-17 DIAGNOSIS — B9689 Other specified bacterial agents as the cause of diseases classified elsewhere: Secondary | ICD-10-CM

## 2022-12-17 DIAGNOSIS — J019 Acute sinusitis, unspecified: Secondary | ICD-10-CM | POA: Diagnosis not present

## 2022-12-17 MED ORDER — AMOXICILLIN-POT CLAVULANATE 875-125 MG PO TABS
1.0000 | ORAL_TABLET | Freq: Two times a day (BID) | ORAL | 0 refills | Status: DC
Start: 2022-12-17 — End: 2023-01-17

## 2022-12-17 MED ORDER — ALBUTEROL SULFATE HFA 108 (90 BASE) MCG/ACT IN AERS
1.0000 | INHALATION_SPRAY | Freq: Four times a day (QID) | RESPIRATORY_TRACT | 0 refills | Status: DC | PRN
Start: 2022-12-17 — End: 2023-03-26

## 2022-12-17 NOTE — Progress Notes (Signed)
Virtual Visit Consent   Brittney [name], you are scheduled for a virtual visit with a [facility] provider today. Just as with appointments in the office, your consent must be obtained to participate. Your consent will be active for this visit and any virtual visit you may have with one of our providers in the next 365 days. If you have a MyChart account, a copy of this consent can be sent to you electronically.  As this is a virtual visit, video technology does not allow for your provider to perform a traditional examination. This may limit your provider's ability to fully assess your condition. If your provider identifies any concerns that need to be evaluated in person or the need to arrange testing (such as labs, EKG, etc.), we will make arrangements to do so. Although advances in technology are sophisticated, we cannot ensure that it will always work on either your end or our end. If the connection with a video visit is poor, the visit may have to be switched to a telephone visit. With either a video or telephone visit, we are not always able to ensure that we have a secure connection.  By engaging in this virtual visit, you consent to the provision of healthcare and authorize for your insurance to be billed (if applicable) for the services provided during this visit. Depending on your insurance coverage, you may receive a charge related to this service.  I need to obtain your verbal consent now. Are you willing to proceed with your visit today? Grindle Cantarero has provided verbal consent on 12/17/2022 for a virtual visit (video or telephone). Margaretann Loveless, PA-C  Date: 12/17/2022 12:19 PM  Virtual Visit via Video Note   I, Margaretann Loveless, connected with  Freidy Burdette  ([id number], 52) on 12/17/22 at 12:15 PM EST by a video-enabled telemedicine application and verified that I am speaking with the correct person using two identifiers.  Location: Patient: Virtual  Visit Location Patient: Home Provider: Virtual Visit Location Provider: Home Office   I discussed the limitations of evaluation and management by telemedicine and the availability of in person appointments. The patient expressed understanding and agreed to proceed.    History of Present Illness: [name] is a 52 y.o. who identifies as a female who was assigned female at birth, and is being seen today for sinus congestion.  HPI: Sinusitis This is a new problem. The current episode started 1 to 4 weeks ago. The problem has been gradually worsening since onset. There has been no fever. The pain is moderate. Associated symptoms include chills, congestion, ear pain (fullness and muffled), headaches and sinus pressure. Pertinent negatives include no coughing, diaphoresis, hoarse voice, shortness of breath or sore throat. (Post nasal drainage) Past treatments include saline nose sprays and nasal decongestants (zyrtec, flonase, mucinex). The treatment provided no relief.     Problems:  Patient Active Problem List   Diagnosis Date Noted   Appendicitis 10/20/2020   Acute appendicitis 10/20/2020   COVID-19 virus infection 05/10/2020   Hyperlipidemia 02/24/2020   Obesity (BMI 35.0-39.9 without comorbidity)    Insomnia    Oral herpes    Anemia    Asthma     Allergies:  Allergies  Allergen Reactions   Other     All nuts   Peanut-Containing Drug Products Hives   Sulfa Antibiotics Nausea And Vomiting   Medications:  Current Outpatient Medications:    albuterol (VENTOLIN HFA) 108 (90 Base) MCG/ACT inhaler, Inhale  1-2 puffs into the lungs every 6 (six) hours as needed., Disp: 8 g, Rfl: 0   ALPRAZolam (XANAX) 0.5 MG tablet, Take 1 tablet (0.5 mg total) by mouth daily as needed., Disp: 90 tablet, Rfl: 0   amoxicillin-clavulanate (AUGMENTIN) 875-125 MG tablet, Take 1 tablet by mouth 2 (two) times daily., Disp: 20 tablet, Rfl: 0   atorvastatin (LIPITOR) 40 MG tablet, Take 1 tablet (40 mg  total) by mouth daily., Disp: 90 tablet, Rfl: 1   cyclobenzaprine (FLEXERIL) 10 MG tablet, Take 1 tablet (10 mg total) by mouth 2 (two) times daily as needed for muscle spasms., Disp: 20 tablet, Rfl: 0   EPINEPHrine 0.3 mg/0.3 mL IJ SOAJ injection, Inject 0.3 mg into the muscle as needed for anaphylaxis., Disp: 1 each, Rfl: 0   fluticasone (FLONASE) 50 MCG/ACT nasal spray, Place 2 sprays into both nostrils daily. (Patient taking differently: Place 2 sprays into both nostrils as needed.), Disp: 16 g, Rfl: 0   loratadine (CLARITIN) 10 MG tablet, Take 10 mg by mouth as needed., Disp: , Rfl:    meloxicam (MOBIC) 7.5 MG tablet, Take 1 tablet (7.5 mg total) by mouth daily., Disp: 30 tablet, Rfl: 0   Multiple Vitamin (MULTIVITAMIN) capsule, Take 1 capsule by mouth daily., Disp: , Rfl:    oxyCODONE-acetaminophen (PERCOCET) 5-325 MG tablet, Take 1-2 tablets by mouth every 8 (eight) hours as needed., Disp: 5 tablet, Rfl: 0   triamcinolone (KENALOG) 0.025 % ointment, Apply 1 Application topically 2 (two) times daily., Disp: 30 g, Rfl: 0   valACYclovir (VALTREX) 1000 MG tablet, Take 1 tablet (1,000 mg total) by mouth daily as needed., Disp: 90 tablet, Rfl: 1   VITAMIN D PO, Take by mouth daily., Disp: , Rfl:   Observations/Objective: Patient is well-developed, well-nourished in no acute distress.  Resting comfortably at home.  Head is normocephalic, atraumatic.  No labored breathing.  Speech is clear and coherent with logical content.  Patient is alert and oriented at baseline.    Assessment and Plan: 1. Acute bacterial sinusitis - amoxicillin-clavulanate (AUGMENTIN) 875-125 MG tablet; Take 1 tablet by mouth 2 (two) times daily.  Dispense: 20 tablet; Refill: 0  2. Mild intermittent asthma with acute exacerbation - albuterol (VENTOLIN HFA) 108 (90 Base) MCG/ACT inhaler; Inhale 1-2 puffs into the lungs every 6 (six) hours as needed.  Dispense: 8 g; Refill: 0  - Worsening symptoms that have not  responded to OTC medications.  - Will give Augmentin - Albuterol refilled - Continue allergy medications.  - Steam and humidifier can help - Stay well hydrated and get plenty of rest.  - Seek in person evaluation if no symptom improvement or if symptoms worsen   Follow Up Instructions: I discussed the assessment and treatment plan with the patient. The patient was provided an opportunity to ask questions and all were answered. The patient agreed with the plan and demonstrated an understanding of the instructions.  A copy of instructions were sent to the patient via MyChart unless otherwise noted below.    The patient was advised to call back or seek an in-person evaluation if the symptoms worsen or if the condition fails to improve as anticipated.    Margaretann Loveless, PA-C

## 2022-12-17 NOTE — Patient Instructions (Signed)
Coralie Carpen, thank you for joining Margaretann Loveless, PA-C for today's virtual visit.  While this provider is not your primary care provider (PCP), if your PCP is located in our provider database this encounter information will be shared with them immediately following your visit.   A [facility] MyChart account gives you access to today's visit and all your visits, tests, and labs performed at [facility] " click here if you don't have a [facility] MyChart account or go to [email or web address]  Consent: (Patient) Brittney [name] provided verbal consent for this virtual visit at the beginning of the encounter.  Current Medications:  Current Outpatient Medications:    albuterol (VENTOLIN HFA) 108 (90 Base) MCG/ACT inhaler, Inhale 1-2 puffs into the lungs every 6 (six) hours as needed., Disp: 8 g, Rfl: 0   ALPRAZolam (XANAX) 0.5 MG tablet, Take 1 tablet (0.5 mg total) by mouth daily as needed., Disp: 90 tablet, Rfl: 0   amoxicillin-clavulanate (AUGMENTIN) 875-125 MG tablet, Take 1 tablet by mouth 2 (two) times daily., Disp: 20 tablet, Rfl: 0   atorvastatin (LIPITOR) 40 MG tablet, Take 1 tablet (40 mg total) by mouth daily., Disp: 90 tablet, Rfl: 1   cyclobenzaprine (FLEXERIL) 10 MG tablet, Take 1 tablet (10 mg total) by mouth 2 (two) times daily as needed for muscle spasms., Disp: 20 tablet, Rfl: 0   EPINEPHrine 0.3 mg/0.3 mL IJ SOAJ injection, Inject 0.3 mg into the muscle as needed for anaphylaxis., Disp: 1 each, Rfl: 0   fluticasone (FLONASE) 50 MCG/ACT nasal spray, Place 2 sprays into both nostrils daily. (Patient taking differently: Place 2 sprays into both nostrils as needed.), Disp: 16 g, Rfl: 0   loratadine (CLARITIN) 10 MG tablet, Take 10 mg by mouth as needed., Disp: , Rfl:    meloxicam (MOBIC) 7.5 MG tablet, Take 1 tablet (7.5 mg total) by mouth daily., Disp: 30 tablet, Rfl: 0   Multiple Vitamin (MULTIVITAMIN) capsule, Take 1 capsule by mouth daily.,  Disp: , Rfl:    oxyCODONE-acetaminophen (PERCOCET) 5-325 MG tablet, Take 1-2 tablets by mouth every 8 (eight) hours as needed., Disp: 5 tablet, Rfl: 0   triamcinolone (KENALOG) 0.025 % ointment, Apply 1 Application topically 2 (two) times daily., Disp: 30 g, Rfl: 0   valACYclovir (VALTREX) 1000 MG tablet, Take 1 tablet (1,000 mg total) by mouth daily as needed., Disp: 90 tablet, Rfl: 1   VITAMIN D PO, Take by mouth daily., Disp: , Rfl:    Medications ordered in this encounter:  Meds ordered this encounter  Medications   amoxicillin-clavulanate (AUGMENTIN) 875-125 MG tablet    Sig: Take 1 tablet by mouth 2 (two) times daily.    Dispense:  20 tablet    Refill:  0    Order Specific Question:   Supervising Provider    Answer:   Loreli Dollar   albuterol (VENTOLIN HFA) 108 (90 Base) MCG/ACT inhaler    Sig: Inhale 1-2 puffs into the lungs every 6 (six) hours as needed.    Dispense:  8 g    Refill:  0    Formulary alternative okay to switch    Order Specific Question:   Supervising Provider    Answer:   Merrilee Jansky [[id number]]     *If you need refills on other medications prior to your next appointment, please contact your pharmacy*  Follow-Up: Call back or seek an in-person evaluation if the symptoms worsen or if the condition fails to improve as  anticipated.  [facility] [phone]  Other Instructions Sinus Infection, Adult A sinus infection, also called sinusitis, is inflammation of your sinuses. Sinuses are hollow spaces in the bones around your face. Your sinuses are located: Around your eyes. In the middle of your forehead. Behind your nose. In your cheekbones. Mucus normally drains out of your sinuses. When your nasal tissues become inflamed or swollen, mucus can become trapped or blocked. This allows bacteria, viruses, and fungi to grow, which leads to infection. Most infections of the sinuses are caused by a virus. A sinus infection can  develop quickly. It can last for up to 4 weeks (acute) or for more than 12 weeks (chronic). A sinus infection often develops after a cold. What are the causes? This condition is caused by anything that creates swelling in the sinuses or stops mucus from draining. This includes: Allergies. Asthma. Infection from bacteria or viruses. Deformities or blockages in your nose or sinuses. Abnormal growths in the nose (nasal polyps). Pollutants, such as chemicals or irritants in the air. Infection from fungi. This is rare. What increases the risk? You are more likely to develop this condition if you: Have a weak body defense system (immune system). Do a lot of swimming or diving. Overuse nasal sprays. Smoke. What are the signs or symptoms? The main symptoms of this condition are pain and a feeling of pressure around the affected sinuses. Other symptoms include: Stuffy nose or congestion that makes it difficult to breathe through your nose. Thick yellow or greenish drainage from your nose. Tenderness, swelling, and warmth over the affected sinuses. A cough that may get worse at night. Decreased sense of smell and taste. Extra mucus that collects in the throat or the back of the nose (postnasal drip) causing a sore throat or bad breath. Tiredness (fatigue). Fever. How is this diagnosed? This condition is diagnosed based on: Your symptoms. Your medical history. A physical exam. Tests to find out if your condition is acute or chronic. This may include: Checking your nose for nasal polyps. Viewing your sinuses using a device that has a light (endoscope). Testing for allergies or bacteria. Imaging tests, such as an MRI or CT scan. In rare cases, a bone biopsy may be done to rule out more serious types of fungal sinus disease. How is this treated? Treatment for a sinus infection depends on the cause and whether your condition is chronic or acute. If caused by a virus, your symptoms should go  away on their own within 10 days. You may be given medicines to relieve symptoms. They include: Medicines that shrink swollen nasal passages (decongestants). A spray that eases inflammation of the nostrils (topical intranasal corticosteroids). Rinses that help get rid of thick mucus in your nose (nasal saline washes). Medicines that treat allergies (antihistamines). Over-the-counter pain relievers. If caused by bacteria, your health care provider may recommend waiting to see if your symptoms improve. Most bacterial infections will get better without antibiotic medicine. You may be given antibiotics if you have: A severe infection. A weak immune system. If caused by narrow nasal passages or nasal polyps, surgery may be needed. Follow these instructions at home: Medicines Take, use, or apply over-the-counter and prescription medicines only as told by your health care provider. These may include nasal sprays. If you were prescribed an antibiotic medicine, take it as told by your health care provider. Do not stop taking the antibiotic even if you start to feel better. Hydrate and humidify  Drink  enough fluid to keep your urine pale yellow. Staying hydrated will help to thin your mucus. Use a cool mist humidifier to keep the humidity level in your home above 50%. Inhale steam for 10-15 minutes, 3-4 times a day, or as told by your health care provider. You can do this in the bathroom while a hot shower is running. Limit your exposure to cool or dry air. Rest Rest as much as possible. Sleep with your head raised (elevated). Make sure you get enough sleep each night. General instructions  Apply a warm, moist washcloth to your face 3-4 times a day or as told by your health care provider. This will help with discomfort. Use nasal saline washes as often as told by your health care provider. Wash your hands often with soap and water to reduce your exposure to germs. If soap and water are not  available, use hand sanitizer. Do not smoke. Avoid being around people who are smoking (secondhand smoke). Keep all follow-up visits. This is important. Contact a health care provider if: You have a fever. Your symptoms get worse. Your symptoms do not improve within 10 days. Get help right away if: You have a severe headache. You have persistent vomiting. You have severe pain or swelling around your face or eyes. You have vision problems. You develop confusion. Your neck is stiff. You have trouble breathing. These symptoms may be an emergency. Get help right away. Call 911. Do not wait to see if the symptoms will go away. Do not drive yourself to the hospital. Summary A sinus infection is soreness and inflammation of your sinuses. Sinuses are hollow spaces in the bones around your face. This condition is caused by nasal tissues that become inflamed or swollen. The swelling traps or blocks the flow of mucus. This allows bacteria, viruses, and fungi to grow, which leads to infection. If you were prescribed an antibiotic medicine, take it as told by your health care provider. Do not stop taking the antibiotic even if you start to feel better. Keep all follow-up visits. This is important. This information is not intended to replace advice given to you by your health care provider. Make sure you discuss any questions you have with your health care provider. Document Revised: 11/28/2020 Document Reviewed: 11/28/2020 Elsevier Patient Education  2024 Elsevier Inc.    If you have been instructed to have an in-person evaluation today at a local Urgent Care facility, please use the link below. It will take you to a list of all of our available [facility], including address, phone number and hours of operation. Please do not delay care.  [facility]  If you or a family member do not have a primary care provider, use the link below to schedule a visit and establish  care. When you choose a [facility] primary care physician or advanced practice provider, you gain a long-term partner in health. Find a Primary Care Provider  Learn more about [facility]'s in-office and virtual care options:  - Get Care Now

## 2023-01-13 ENCOUNTER — Other Ambulatory Visit: Payer: Self-pay | Admitting: Internal Medicine

## 2023-01-13 DIAGNOSIS — S76311A Strain of muscle, fascia and tendon of the posterior muscle group at thigh level, right thigh, initial encounter: Secondary | ICD-10-CM

## 2023-01-17 ENCOUNTER — Other Ambulatory Visit: Payer: Self-pay

## 2023-01-17 ENCOUNTER — Ambulatory Visit
Admission: RE | Admit: 2023-01-17 | Discharge: 2023-01-17 | Disposition: A | Discharge status: Home / Self Care | Payer: BC Managed Care – PPO | Source: Ambulatory Visit | Attending: Physician Assistant | Admitting: Physician Assistant

## 2023-01-17 ENCOUNTER — Ambulatory Visit (INDEPENDENT_AMBULATORY_CARE_PROVIDER_SITE_OTHER): Payer: BC Managed Care – PPO

## 2023-01-17 VITALS — BP 112/81 | HR 67 | Temp 97.9°F | Resp 18

## 2023-01-17 DIAGNOSIS — R051 Acute cough: Secondary | ICD-10-CM | POA: Diagnosis not present

## 2023-01-17 DIAGNOSIS — J40 Bronchitis, not specified as acute or chronic: Secondary | ICD-10-CM | POA: Diagnosis not present

## 2023-01-17 DIAGNOSIS — J329 Chronic sinusitis, unspecified: Secondary | ICD-10-CM | POA: Diagnosis not present

## 2023-01-17 DIAGNOSIS — J4521 Mild intermittent asthma with (acute) exacerbation: Secondary | ICD-10-CM | POA: Diagnosis not present

## 2023-01-17 MED ORDER — DOXYCYCLINE HYCLATE 100 MG PO CAPS: 100.0000 mg | ORAL_CAPSULE | Freq: Two times a day (BID) | ORAL | 0 refills | Status: DC

## 2023-01-17 MED ORDER — PREDNISONE 10 MG (21) PO TBPK: ORAL_TABLET | ORAL | 0 refills | Status: DC

## 2023-01-17 MED ORDER — ALBUTEROL SULFATE (2.5 MG/3ML) 0.083% IN NEBU: 2.5000 mg | INHALATION_SOLUTION | Freq: Four times a day (QID) | RESPIRATORY_TRACT | 0 refills | Status: AC | PRN

## 2023-01-17 MED ORDER — PROMETHAZINE-DM 6.25-15 MG/5ML PO SYRP: 5.0000 mL | ORAL_SOLUTION | Freq: Two times a day (BID) | ORAL | 0 refills | Status: DC | PRN

## 2023-01-17 NOTE — ED Provider Notes (Signed)
 [facility]    CSN: [id number] Arrival date & time: 01/17/23  1204      History   Chief Complaint Chief Complaint  Patient presents with   Cough    Entered by patient    HPI Anisah Kuck is a 53 y.o. female.   Patient presents today with a week plus long history of URI symptoms including cough, sinus pressure, postnasal drainage, wheezing/shortness of breath, low-grade fever, bilateral otalgia.  She does have a sensation of tightness in her chest but denies any chest pain, nausea, vomiting, diarrhea.  Reports her husband was sick with similar symptoms but he recovered after a few days and hers have been persisting.  She initially thought she was improving but then had recent double worsening over the past few days.  She has been taking multiple over-the-counter medications including Mucinex, Tylenol  without improvement of symptoms.  She does have a history of asthma and has been using her albuterol  inhaler more frequently without symptom improvement.  Denies hospitalization or previous intubation related to asthma.  She was treated with steroids and antibiotics 12/17/2022 (Augmentin ).  Denies additional antibiotics or steroids in the past 90 days.  She denies history of smoking or diabetes.  She is having difficulty with her daily duties as a result of the symptoms.    Past Medical History:  Diagnosis Date   Allergy    seasonal   Anemia    Anxiety    Asthma    Insomnia    Obesity (BMI 35.0-39.9 without comorbidity)    Oral herpes     Patient Active Problem List   Diagnosis Date Noted   Appendicitis 10/20/2020   Acute appendicitis 10/20/2020   COVID-19 virus infection 05/10/2020   Hyperlipidemia 02/24/2020   Obesity (BMI 35.0-39.9 without comorbidity)    Insomnia    Oral herpes    Anemia    Asthma     Past Surgical History:  Procedure Laterality Date   APPENDECTOMY     BILATERAL SALPINGOOPHORECTOMY     ovaries left   CHOLECYSTECTOMY      COLONOSCOPY     LAPAROSCOPIC APPENDECTOMY N/A 10/20/2020   Procedure: APPENDECTOMY LAPAROSCOPIC;  Surgeon: Stevie Herlene Righter, MD;  Location: [facility] OR;  Service: General;  Laterality: N/A;   lapband     TONSILLECTOMY     tummy tuck      OB History     Gravida  1   Para  0   Term  0   Preterm  0   AB  0   Living  1      SAB  0   IAB  0   Ectopic  0   Multiple  0   Live Births  1            Home Medications    Prior to Admission medications   Medication Sig Start Date End Date Taking? Authorizing Provider  albuterol  (PROVENTIL ) (2.5 MG/3ML) 0.083% nebulizer solution Take 3 mLs (2.5 mg total) by nebulization every 6 (six) hours as needed for wheezing or shortness of breath. 01/17/23  Yes Desani Sprung K, PA-C  albuterol  (VENTOLIN  HFA) 108 (90 Base) MCG/ACT inhaler Inhale 1-2 puffs into the lungs every 6 (six) hours as needed. 12/17/22  Yes Vivienne Delon HERO, PA-C  ALPRAZolam  (XANAX ) 0.5 MG tablet Take 1 tablet (0.5 mg total) by mouth daily as needed. 03/14/22  Yes Theophilus Andrews, Tully GRADE, MD  atorvastatin  (LIPITOR) 40 MG tablet Take 1 tablet (40  mg total) by mouth daily. 04/30/22  Yes Theophilus Andrews, Tully GRADE, MD  cyclobenzaprine  (FLEXERIL ) 10 MG tablet Take 1 tablet (10 mg total) by mouth 2 (two) times daily as needed for muscle spasms. 07/24/22  Yes Mesner, Selinda, MD  doxycycline  (VIBRAMYCIN ) 100 MG capsule Take 1 capsule (100 mg total) by mouth 2 (two) times daily. 01/17/23  Yes Charmane Protzman K, PA-C  EPINEPHrine  0.3 mg/0.3 mL IJ SOAJ injection Inject 0.3 mg into the muscle as needed for anaphylaxis. 06/12/21  Yes Dean Clarity, MD  fluticasone  (FLONASE ) 50 MCG/ACT nasal spray Place 2 sprays into both nostrils daily. Patient taking differently: Place 2 sprays into both nostrils as needed. 12/19/20  Yes Gladis Elsie BROCKS, PA-C  Multiple Vitamin (MULTIVITAMIN) capsule Take 1 capsule by mouth daily.   Yes [provider]  predniSONE  (STERAPRED UNI-PAK 21  TAB) 10 MG (21) TBPK tablet As directed 01/17/23  Yes Catlyn Shipton K, PA-C  promethazine -dextromethorphan (PROMETHAZINE -DM) 6.25-15 MG/5ML syrup Take 5 mLs by mouth 2 (two) times daily as needed for cough. 01/17/23  Yes Izaya Netherton K, PA-C  valACYclovir  (VALTREX ) 1000 MG tablet Take 1 tablet (1,000 mg total) by mouth daily as needed. 12/17/21  Yes Theophilus Andrews Tully GRADE, MD  VITAMIN D  PO Take by mouth daily.   Yes [provider]    Family History Family History  Problem Relation Age of Onset   Healthy Mother    Cancer Father    Pancreatic cancer Father    Breast cancer Maternal Grandmother    Colon cancer Neg Hx    Colon polyps Neg Hx    Esophageal cancer Neg Hx    Rectal cancer Neg Hx    Stomach cancer Neg Hx     Social History Social History   Tobacco Use   Smoking status: Never   Smokeless tobacco: Never  Vaping Use   Vaping status: Never Used  Substance Use Topics   Alcohol use: Not Currently   Drug use: Never     Allergies   Other, Peanut-containing drug products, and Sulfa antibiotics   Review of Systems Review of Systems  Constitutional:  Positive for activity change, fatigue and fever. Negative for appetite change.  HENT:  Positive for congestion, ear pain, postnasal drip and sinus pressure. Negative for sneezing and sore throat.   Respiratory:  Positive for cough, chest tightness, shortness of breath and wheezing.   Cardiovascular:  Negative for chest pain.  Gastrointestinal:  Negative for abdominal pain, diarrhea, nausea and vomiting.  Neurological:  Positive for headaches.     Physical Exam Triage Vital Signs ED Triage Vitals  Encounter Vitals Group     BP 01/17/23 1250 112/81     Systolic BP Percentile --      Diastolic BP Percentile --      Pulse Rate 01/17/23 1250 67     Resp 01/17/23 1250 18     Temp 01/17/23 1250 97.9 F (36.6 C)     Temp Source 01/17/23 1250 Oral     SpO2 01/17/23 1250 99 %     Weight --      Height --       Head Circumference --      Peak Flow --      Pain Score 01/17/23 1246 9     Pain Loc --      Pain Education --      Exclude from Growth Chart --    No data found.  Updated Vital Signs BP  112/81 (BP Location: Left Arm)   Pulse 67   Temp 97.9 F (36.6 C) (Oral)   Resp 18   SpO2 99%   Visual Acuity Right Eye Distance:   Left Eye Distance:   Bilateral Distance:    Right Eye Near:   Left Eye Near:    Bilateral Near:     Physical Exam Vitals reviewed.  Constitutional:      General: She is awake. She is not in acute distress.    Appearance: Normal appearance. She is well-developed. She is not ill-appearing.     Comments: Very pleasant female appears stated age in no acute distress sitting comfortably in exam room  HENT:     Head: Normocephalic and atraumatic.     Right Ear: Tympanic membrane, ear canal and external ear normal. Tympanic membrane is not erythematous or bulging.     Left Ear: Tympanic membrane, ear canal and external ear normal. Tympanic membrane is not erythematous or bulging.     Nose:     Right Sinus: Maxillary sinus tenderness and frontal sinus tenderness present.     Left Sinus: Maxillary sinus tenderness and frontal sinus tenderness present.     Mouth/Throat:     Pharynx: Uvula midline. Postnasal drip present. No oropharyngeal exudate or posterior oropharyngeal erythema.  Cardiovascular:     Rate and Rhythm: Normal rate and regular rhythm.     Heart sounds: Normal heart sounds, S1 normal and S2 normal. No murmur heard. Pulmonary:     Effort: Pulmonary effort is normal.     Breath sounds: Examination of the right-lower field reveals decreased breath sounds. Examination of the left-lower field reveals decreased breath sounds. Decreased breath sounds present. No wheezing, rhonchi or rales.  Psychiatric:        Behavior: Behavior is cooperative.      UC Treatments / Results  Labs (all labs ordered are listed, but only abnormal results are  displayed) Labs Reviewed - No data to display  EKG   Radiology DG Chest 2 View Result Date: 01/17/2023 CLINICAL DATA:  Worsening cough. EXAM: CHEST - 2 VIEW COMPARISON:  None Available. FINDINGS: No consolidation, pneumothorax or effusion. Normal cardiopericardial silhouette without edema. Films are under penetrated. Degenerative changes of the spine. Gastric band device seen in the upper abdomen at the edge of the imaging field. IMPRESSION: Limited x-rays. Grossly no acute cardiopulmonary disease. Gastric band Electronically Signed   By: Ranell Bring M.D.   On: 01/17/2023 13:15    Procedures Procedures (including critical care time)  Medications Ordered in UC Medications - No data to display  Initial Impression / Assessment and Plan / UC Course  I have reviewed the triage vital signs and the nursing notes.  Pertinent labs & imaging results that were available during my care of the patient were reviewed by me and considered in my medical decision making (see chart for details).     Patient is well-appearing, afebrile, nontoxic, nontachycardic.  Chest x-ray was obtained given decreased aeration of bilateral bases that showed no acute acute cardiopulmonary disease.  No indication for viral testing as patient has been symptomatic for over a week and this would not change management.  Given her prolonged and worsening symptoms will cover for secondary bacterial infection.  She recently was given Augmentin  so we will start doxycycline  100 mg twice daily for 10 days.  Discussed that she is to avoid prolonged sun exposure while on this medication due to associated photosensitivity.  She was given Promethazine  DM  for cough.  We discussed that this can be sedating and she is not to drive or drink alcohol while taking it.  I am concerned that she has an asthma exacerbation given her increased use of albuterol .  She does have an albuterol  inhaler available but requested a refill of nebulizer medication  which was sent to her pharmacy.  Will also start prednisone  taper and we discussed that she is not to take NSAIDs with this medication.  She can use over-the-counter medicines as needed.  Recommend close follow-up with her primary care to consider maintenance medication such as Symbicort given her recurrent respiratory symptoms.  We discussed that if she has any worsening or changing symptoms she needs to be seen immediately.  Strict return precautions given.  Final Clinical Impressions(s) / UC Diagnoses   Final diagnoses:  Acute cough  Sinobronchitis  Mild intermittent asthma with acute exacerbation     Discharge Instructions      Your x-ray did not show any evidence of pneumonia.  I am concerned that you have a sinus/bronchitis infection.  Start doxycycline  100 mg twice daily for 10 days.  Stay out of the sun while on this medication.  I suspect that this is triggered her asthma.  Start prednisone  taper as prescribed.  Do not take NSAIDs with this medication including aspirin, ibuprofen /Advil , naproxen/Aleve.  You can use Tylenol , Mucinex, nasal saline/sinus rinses for additional symptom relief.  Make sure you rest and drink plenty of fluid.  Given your current symptoms I do recommend you follow-up with your primary care to consider if a maintenance medication such as Symbicort would be beneficial.  Call them to schedule an appointment.  If anything worsens and you have worsening cough, high fever, chest pain, shortness of breath, nausea/vomiting interfering with oral intake you need to be seen immediately.     ED Prescriptions     Medication Sig Dispense Auth. Provider   predniSONE  (STERAPRED UNI-PAK 21 TAB) 10 MG (21) TBPK tablet As directed 21 tablet Chrisoula Zegarra K, PA-C   doxycycline  (VIBRAMYCIN ) 100 MG capsule Take 1 capsule (100 mg total) by mouth 2 (two) times daily. 20 capsule Brecken Walth K, PA-C   promethazine -dextromethorphan (PROMETHAZINE -DM) 6.25-15 MG/5ML syrup Take 5 mLs by  mouth 2 (two) times daily as needed for cough. 118 mL Rebbeca Sheperd K, PA-C   albuterol  (PROVENTIL ) (2.5 MG/3ML) 0.083% nebulizer solution Take 3 mLs (2.5 mg total) by nebulization every 6 (six) hours as needed for wheezing or shortness of breath. 75 mL Jarrius Huaracha K, PA-C      PDMP not reviewed this encounter.   Sherrell Rocky POUR, PA-C 01/17/23 1329

## 2023-01-17 NOTE — Discharge Instructions (Signed)
 Your x-ray did not show any evidence of pneumonia.  I am concerned that you have a sinus/bronchitis infection.  Start doxycycline  100 mg twice daily for 10 days.  Stay out of the sun while on this medication.  I suspect that this is triggered her asthma.  Start prednisone  taper as prescribed.  Do not take NSAIDs with this medication including aspirin, ibuprofen /Advil , naproxen/Aleve.  You can use Tylenol , Mucinex, nasal saline/sinus rinses for additional symptom relief.  Make sure you rest and drink plenty of fluid.  Given your current symptoms I do recommend you follow-up with your primary care to consider if a maintenance medication such as Symbicort would be beneficial.  Call them to schedule an appointment.  If anything worsens and you have worsening cough, high fever, chest pain, shortness of breath, nausea/vomiting interfering with oral intake you need to be seen immediately.

## 2023-01-17 NOTE — ED Triage Notes (Addendum)
 Cough and sinus sx 1 week- tried mucinex D and DM. Mid-week felt better then Sx worsened. Croupy cough last night and chest feels tight, ear pain also. She was on Augmentin in Dec for ear infection

## 2023-02-04 ENCOUNTER — Telehealth: Payer: Self-pay

## 2023-02-04 DIAGNOSIS — D509 Iron deficiency anemia, unspecified: Secondary | ICD-10-CM

## 2023-02-04 DIAGNOSIS — E88819 Insulin resistance, unspecified: Secondary | ICD-10-CM

## 2023-02-04 DIAGNOSIS — E782 Mixed hyperlipidemia: Secondary | ICD-10-CM

## 2023-02-04 DIAGNOSIS — Z0000 Encounter for general adult medical examination without abnormal findings: Secondary | ICD-10-CM

## 2023-02-04 NOTE — Addendum Note (Signed)
Addended by: Kern Reap B on: 02/04/2023 01:54 PM   Modules accepted: Orders

## 2023-02-04 NOTE — Telephone Encounter (Signed)
Labs ordered and appointment scheduled.

## 2023-02-04 NOTE — Telephone Encounter (Signed)
Copied from CRM [id number]. Topic: Clinical - Request for Lab/Test Order >> Feb 04, 2023 10:38 AM Orinda Kenner C wrote: Reason for CRM: Patient has an physical appointment for 03/26/22 and wants to have labs done prior to office visit. Can this be done? Patient still has standing labs order for Insulin, Free, A1C, and Vitamin D. Please advise. Patient can be contact via MyChart.

## 2023-02-12 ENCOUNTER — Other Ambulatory Visit (INDEPENDENT_AMBULATORY_CARE_PROVIDER_SITE_OTHER): Payer: BC Managed Care – PPO

## 2023-02-12 DIAGNOSIS — Z13220 Encounter for screening for lipoid disorders: Secondary | ICD-10-CM | POA: Diagnosis not present

## 2023-02-12 DIAGNOSIS — Z0000 Encounter for general adult medical examination without abnormal findings: Secondary | ICD-10-CM

## 2023-02-12 DIAGNOSIS — D509 Iron deficiency anemia, unspecified: Secondary | ICD-10-CM

## 2023-02-12 DIAGNOSIS — E88819 Insulin resistance, unspecified: Secondary | ICD-10-CM

## 2023-02-12 LAB — CBC WITH DIFFERENTIAL/PLATELET
Basophils Absolute: 0 10*3/uL (ref 0.0–0.1)
Basophils Relative: 0.6 % (ref 0.0–3.0)
Eosinophils Absolute: 0.1 10*3/uL (ref 0.0–0.7)
Eosinophils Relative: 3 % (ref 0.0–5.0)
HCT: 40.2 % (ref 36.0–46.0)
Hemoglobin: 13.2 g/dL (ref 12.0–15.0)
Lymphocytes Relative: 37.7 % (ref 12.0–46.0)
Lymphs Abs: 1.8 10*3/uL (ref 0.7–4.0)
MCHC: 32.9 g/dL (ref 30.0–36.0)
MCV: 91.2 fL (ref 78.0–100.0)
Monocytes Absolute: 0.4 10*3/uL (ref 0.1–1.0)
Monocytes Relative: 8.3 % (ref 3.0–12.0)
Neutro Abs: 2.4 10*3/uL (ref 1.4–7.7)
Neutrophils Relative %: 50.4 % (ref 43.0–77.0)
Platelets: 237 10*3/uL (ref 150.0–400.0)
RBC: 4.41 Mil/uL (ref 3.87–5.11)
RDW: 13.4 % (ref 11.5–15.5)
WBC: 4.7 10*3/uL (ref 4.0–10.5)

## 2023-02-12 LAB — COMPREHENSIVE METABOLIC PANEL
ALT: 11 U/L (ref 0–35)
AST: 17 U/L (ref 0–37)
Albumin: 4.4 g/dL (ref 3.5–5.2)
Alkaline Phosphatase: 79 U/L (ref 39–117)
BUN: 17 mg/dL (ref 6–23)
CO2: 27 meq/L (ref 19–32)
Calcium: 9.7 mg/dL (ref 8.4–10.5)
Chloride: 102 meq/L (ref 96–112)
Creatinine, Ser: 0.87 mg/dL (ref 0.40–1.20)
GFR: 76.11 mL/min (ref >60.00)
Glucose, Bld: 95 mg/dL (ref 70–99)
Potassium: 3.9 meq/L (ref 3.5–5.1)
Sodium: 140 meq/L (ref 135–145)
Total Bilirubin: 0.7 mg/dL (ref 0.2–1.2)
Total Protein: 7.2 g/dL (ref 6.0–8.3)

## 2023-02-12 LAB — LIPID PANEL
Cholesterol: 227 mg/dL — ABNORMAL HIGH (ref 0–200)
HDL: 60.7 mg/dL (ref >39.00)
LDL Cholesterol: 139 mg/dL — ABNORMAL HIGH (ref 0–99)
NonHDL: 165.84
Total CHOL/HDL Ratio: 4
Triglycerides: 136 mg/dL (ref 0.0–149.0)
VLDL: 27.2 mg/dL (ref 0.0–40.0)

## 2023-02-12 LAB — VITAMIN D 25 HYDROXY (VIT D DEFICIENCY, FRACTURES)
VITD: 81.22 ng/mL (ref 30.00–100.00)
VITD: 84.2 ng/mL (ref 30.00–100.00)

## 2023-02-12 LAB — VITAMIN B12: Vitamin B-12: 544 pg/mL (ref 211–911)

## 2023-02-12 LAB — HEMOGLOBIN A1C: Hgb A1c MFr Bld: 5.8 % (ref 4.6–6.5)

## 2023-02-12 LAB — TSH: TSH: 1.66 u[IU]/mL (ref 0.35–5.50)

## 2023-02-20 LAB — INSULIN, FREE (BIOACTIVE): Insulin, Free: 8.4 u[IU]/mL (ref 1.5–14.9)

## 2023-03-26 ENCOUNTER — Encounter: Payer: Self-pay | Admitting: Internal Medicine

## 2023-03-26 ENCOUNTER — Ambulatory Visit (INDEPENDENT_AMBULATORY_CARE_PROVIDER_SITE_OTHER): Payer: BC Managed Care – PPO | Admitting: Internal Medicine

## 2023-03-26 VITALS — BP 110/70 | HR 79 | Temp 97.8°F | Ht 67.0 in | Wt 239.7 lb

## 2023-03-26 DIAGNOSIS — J4521 Mild intermittent asthma with (acute) exacerbation: Secondary | ICD-10-CM | POA: Diagnosis not present

## 2023-03-26 DIAGNOSIS — B002 Herpesviral gingivostomatitis and pharyngotonsillitis: Secondary | ICD-10-CM | POA: Diagnosis not present

## 2023-03-26 DIAGNOSIS — Z0000 Encounter for general adult medical examination without abnormal findings: Secondary | ICD-10-CM

## 2023-03-26 DIAGNOSIS — J302 Other seasonal allergic rhinitis: Secondary | ICD-10-CM | POA: Diagnosis not present

## 2023-03-26 DIAGNOSIS — E669 Obesity, unspecified: Secondary | ICD-10-CM

## 2023-03-26 DIAGNOSIS — E782 Mixed hyperlipidemia: Secondary | ICD-10-CM

## 2023-03-26 MED ORDER — VALACYCLOVIR HCL 1 G PO TABS
1000.0000 mg | ORAL_TABLET | Freq: Every day | ORAL | 1 refills | Status: DC | PRN
Start: 2023-03-26 — End: 2023-04-03

## 2023-03-26 MED ORDER — CETIRIZINE HCL 10 MG PO TABS: 10.0000 mg | ORAL_TABLET | Freq: Every day | ORAL | 11 refills | Status: DC

## 2023-03-26 MED ORDER — ALBUTEROL SULFATE HFA 108 (90 BASE) MCG/ACT IN AERS
1.0000 | INHALATION_SPRAY | Freq: Four times a day (QID) | RESPIRATORY_TRACT | 0 refills | Status: DC | PRN
Start: 2023-03-26 — End: 2023-04-03

## 2023-03-26 NOTE — Progress Notes (Signed)
 Established Patient Office Visit     CC/Reason for Visit: Annual preventive exam, discuss acute concerns  HPI: Brittney [name] is a 53 y.o. female who is coming in today for the above mentioned reasons. Past Medical History is significant for: Hyperlipidemia, obesity, asthma, seasonal allergies, insomnia.  She is feeling well.  Has routine eye and dental care.  Immunizations are up-to-date.  Cancer screenings are up-to-date and age-appropriate.  She has not been using her statin, she states she has had issues with statins in the past and is hesitant to take them.  She wonders about a coronary CT.  She also wonders about GLP-1 treatment for management of obesity.   Past Medical/Surgical History: Past Medical History:  Diagnosis Date   Allergy    seasonal   Anemia    Anxiety    Asthma    Insomnia    Obesity (BMI 35.0-39.9 without comorbidity)    Oral herpes     Past Surgical History:  Procedure Laterality Date   APPENDECTOMY     BILATERAL SALPINGOOPHORECTOMY     ovaries left   CHOLECYSTECTOMY     COLONOSCOPY     LAPAROSCOPIC APPENDECTOMY N/A 10/20/2020   Procedure: APPENDECTOMY LAPAROSCOPIC;  Surgeon: Kinsinger, De Blanch, MD;  Location: [facility] OR;  Service: General;  Laterality: N/A;   lapband     TONSILLECTOMY     tummy tuck      Social History:  reports that she has never smoked. She has never used smokeless tobacco. She reports that she does not currently use alcohol. She reports that she does not use drugs.  Allergies: Allergies  Allergen Reactions   Other     All nuts   Peanut-Containing Drug Products Hives   Sulfa Antibiotics Nausea And Vomiting    Family History:  Family History  Problem Relation Age of Onset   Healthy Mother    Cancer Father    Pancreatic cancer Father    Breast cancer Maternal Grandmother    Colon cancer Neg Hx    Colon polyps Neg Hx    Esophageal cancer Neg Hx    Rectal cancer Neg Hx    Stomach cancer Neg Hx       Current Outpatient Medications:    albuterol (PROVENTIL) (2.5 MG/3ML) 0.083% nebulizer solution, Take 3 mLs (2.5 mg total) by nebulization every 6 (six) hours as needed for wheezing or shortness of breath., Disp: 75 mL, Rfl: 0   ALPRAZolam (XANAX) 0.5 MG tablet, Take 1 tablet (0.5 mg total) by mouth daily as needed., Disp: 90 tablet, Rfl: 0   atorvastatin (LIPITOR) 40 MG tablet, Take 1 tablet (40 mg total) by mouth daily., Disp: 90 tablet, Rfl: 1   cetirizine (ZYRTEC) 10 MG tablet, Take 1 tablet (10 mg total) by mouth daily., Disp: 30 tablet, Rfl: 11   cyclobenzaprine (FLEXERIL) 10 MG tablet, Take 1 tablet (10 mg total) by mouth 2 (two) times daily as needed for muscle spasms., Disp: 20 tablet, Rfl: 0   doxycycline (VIBRAMYCIN) 100 MG capsule, Take 1 capsule (100 mg total) by mouth 2 (two) times daily., Disp: 20 capsule, Rfl: 0   EPINEPHrine 0.3 mg/0.3 mL IJ SOAJ injection, Inject 0.3 mg into the muscle as needed for anaphylaxis., Disp: 1 each, Rfl: 0   fluticasone (FLONASE) 50 MCG/ACT nasal spray, Place 2 sprays into both nostrils daily. (Patient taking differently: Place 2 sprays into both nostrils as needed.), Disp: 16 g, Rfl: 0   Multiple Vitamin (MULTIVITAMIN) capsule,  Take 1 capsule by mouth daily., Disp: , Rfl:    promethazine-dextromethorphan (PROMETHAZINE-DM) 6.25-15 MG/5ML syrup, Take 5 mLs by mouth 2 (two) times daily as needed for cough., Disp: 118 mL, Rfl: 0   VITAMIN D PO, Take by mouth daily., Disp: , Rfl:    albuterol (VENTOLIN HFA) 108 (90 Base) MCG/ACT inhaler, Inhale 1-2 puffs into the lungs every 6 (six) hours as needed., Disp: 8 g, Rfl: 0   valACYclovir (VALTREX) 1000 MG tablet, Take 1 tablet (1,000 mg total) by mouth daily as needed., Disp: 90 tablet, Rfl: 1  Review of Systems:  Negative unless indicated in HPI.   Physical Exam: Vitals:   03/26/23 1303  BP: 110/70  Pulse: 79  Temp: 97.8 F (36.6 C)  TempSrc: Oral  SpO2: 99%  Weight: 239 lb 11.2 oz (108.7 kg)   Height: 5\' 7"  (1.702 m)    Body mass index is 37.54 kg/m.   Physical Exam Vitals reviewed.  Constitutional:      General: She is not in acute distress.    Appearance: Normal appearance. She is obese. She is not ill-appearing, toxic-appearing or diaphoretic.  HENT:     Head: Normocephalic.     Right Ear: Tympanic membrane, ear canal and external ear normal. There is no impacted cerumen.     Left Ear: Tympanic membrane, ear canal and external ear normal. There is no impacted cerumen.     Nose: Nose normal.     Mouth/Throat:     Mouth: Mucous membranes are moist.     Pharynx: Oropharynx is clear. No oropharyngeal exudate or posterior oropharyngeal erythema.  Eyes:     General: No scleral icterus.       Right eye: No discharge.        Left eye: No discharge.     Conjunctiva/sclera: Conjunctivae normal.     Pupils: Pupils are equal, round, and reactive to light.  Neck:     Vascular: No carotid bruit.  Cardiovascular:     Rate and Rhythm: Normal rate and regular rhythm.     Pulses: Normal pulses.     Heart sounds: Normal heart sounds.  Pulmonary:     Effort: Pulmonary effort is normal. No respiratory distress.     Breath sounds: Normal breath sounds.  Abdominal:     General: Abdomen is flat. Bowel sounds are normal.     Palpations: Abdomen is soft.  Musculoskeletal:        General: Normal range of motion.     Cervical back: Normal range of motion.  Skin:    General: Skin is warm and dry.  Neurological:     General: No focal deficit present.     Mental Status: She is alert and oriented to person, place, and time. Mental status is at baseline.  Psychiatric:        Mood and Affect: Mood normal.        Behavior: Behavior normal.        Thought Content: Thought content normal.        Judgment: Judgment normal.     Flowsheet Row Office Visit from 11/20/2022 in [facility] [place]  PHQ-9 Total Score 8        Impression and Plan:  Encounter  for preventive health examination  Mild intermittent asthma with acute exacerbation -     Albuterol Sulfate HFA; Inhale 1-2 puffs into the lungs every 6 (six) hours as needed.  Dispense: 8 g; Refill: 0  Oral  herpes -     valACYclovir HCl; Take 1 tablet (1,000 mg total) by mouth daily as needed.  Dispense: 90 tablet; Refill: 1  Seasonal allergies -     Cetirizine HCl; Take 1 tablet (10 mg total) by mouth daily.  Dispense: 30 tablet; Refill: 11  Obesity (BMI 35.0-39.9 without comorbidity) -     CT CARDIAC SCORING (SELF PAY ONLY); Future  Mixed hyperlipidemia -     CT CARDIAC SCORING (SELF PAY ONLY); Future   -Recommend routine eye and dental care. -Healthy lifestyle discussed in detail. -Labs to be updated today. -Prostate cancer screening: Not applicable Health Maintenance  Topic Date Due   Pneumococcal Vaccination (1 of 2 - PCV) Never done   HIV Screening  Never done   Hepatitis C Screening  Never done   Pap with HPV screening  Never done   COVID-19 Vaccine (3 - 2024-25 season) 09/08/2022   Zoster (Shingles) Vaccine (2 of 2) 12/24/2022   Colon Cancer Screening  02/29/2024   Mammogram  06/10/2024   DTaP/Tdap/Td vaccine (2 - Td or Tdap) 07/17/2032   Flu Shot  Completed   HPV Vaccine  Aged Out     -Send cetirizine for seasonal allergies. -Sent for coronary CT for calcium scoring.  If high percentile she would be agreeable to trying statins again. -We have extensively discussed GLP-1 treatment for weight loss.  She will discuss with insurance whether they will cover.  If not she is willing to proceed directly through drug company.     Chaya Jan, MD [facility] at [facility]

## 2023-04-03 ENCOUNTER — Other Ambulatory Visit: Payer: Self-pay | Admitting: *Deleted

## 2023-04-03 DIAGNOSIS — B002 Herpesviral gingivostomatitis and pharyngotonsillitis: Secondary | ICD-10-CM

## 2023-04-03 DIAGNOSIS — J4521 Mild intermittent asthma with (acute) exacerbation: Secondary | ICD-10-CM

## 2023-04-03 DIAGNOSIS — J302 Other seasonal allergic rhinitis: Secondary | ICD-10-CM

## 2023-04-03 MED ORDER — ALBUTEROL SULFATE HFA 108 (90 BASE) MCG/ACT IN AERS
1.0000 | INHALATION_SPRAY | Freq: Four times a day (QID) | RESPIRATORY_TRACT | 2 refills | Status: DC | PRN
Start: 2023-04-03 — End: 2023-04-21

## 2023-04-03 MED ORDER — CETIRIZINE HCL 10 MG PO TABS
10.0000 mg | ORAL_TABLET | Freq: Every day | ORAL | 1 refills | Status: DC
Start: 2023-04-03 — End: 2023-05-14

## 2023-04-03 MED ORDER — VALACYCLOVIR HCL 1 G PO TABS
1000.0000 mg | ORAL_TABLET | Freq: Every day | ORAL | 1 refills | Status: AC | PRN
Start: 2023-04-03 — End: ?

## 2023-04-08 ENCOUNTER — Telehealth: Payer: Self-pay

## 2023-04-08 ENCOUNTER — Other Ambulatory Visit (HOSPITAL_COMMUNITY): Payer: Self-pay

## 2023-04-08 NOTE — Telephone Encounter (Signed)
 Pharmacy Patient Advocate Encounter   Received notification from Patient Advice Request messages that prior authorization for [facility] is required/requested.   Insurance verification completed.   The patient is insured through [employer] .   Per test claim: PA required; PA submitted to above mentioned insurance via CoverMyMeds Key/confirmation #/EOC [id number] Status is pending

## 2023-04-09 ENCOUNTER — Other Ambulatory Visit (HOSPITAL_COMMUNITY): Payer: Self-pay

## 2023-04-09 ENCOUNTER — Ambulatory Visit (HOSPITAL_BASED_OUTPATIENT_CLINIC_OR_DEPARTMENT_OTHER)
Admission: RE | Admit: 2023-04-09 | Discharge: 2023-04-09 | Disposition: A | Discharge status: Home / Self Care | Source: Ambulatory Visit | Attending: Internal Medicine | Admitting: Internal Medicine

## 2023-04-09 DIAGNOSIS — E669 Obesity, unspecified: Secondary | ICD-10-CM | POA: Insufficient documentation

## 2023-04-09 DIAGNOSIS — E782 Mixed hyperlipidemia: Secondary | ICD-10-CM | POA: Insufficient documentation

## 2023-04-11 NOTE — Telephone Encounter (Signed)
 Pharmacy Patient Advocate Encounter  Received notification from [employer] that Prior Authorization for [facility] 0.25MG /0.5ML auto-injectors has been DENIED.  Full denial letter will be uploaded to the media tab. See denial reason below.   PA #/Case ID/Reference #: [id number]

## 2023-04-14 ENCOUNTER — Telehealth: Payer: Self-pay

## 2023-04-14 ENCOUNTER — Other Ambulatory Visit (HOSPITAL_COMMUNITY): Payer: Self-pay

## 2023-04-14 NOTE — Telephone Encounter (Signed)
 PA request has been  resubmitted . New Encounter has been or will be created for follow up. For additional info see Pharmacy Prior Auth telephone encounter from 04/14/23.    This denial is from the PA I submitted on CMM. I selected No for the question about Phentermine because there was no documentation of patient trying Phentermine.  Not sure what happened to the form that was faxed in. I did resubmit the PA with the information that was put on the form.

## 2023-04-14 NOTE — Telephone Encounter (Signed)
 Pharmacy Patient Advocate Encounter   Received notification from Pt Calls Messages that prior authorization for [facility] is required/requested.   Insurance verification completed.   The patient is insured through [employer] .   Per test claim: PA required; PA submitted to above mentioned insurance via CoverMyMeds Key/confirmation #/EOC [id number] Status is pending

## 2023-04-15 ENCOUNTER — Other Ambulatory Visit: Payer: Self-pay | Admitting: Internal Medicine

## 2023-04-15 ENCOUNTER — Other Ambulatory Visit (HOSPITAL_BASED_OUTPATIENT_CLINIC_OR_DEPARTMENT_OTHER): Payer: Self-pay

## 2023-04-15 ENCOUNTER — Other Ambulatory Visit: Payer: Self-pay

## 2023-04-15 ENCOUNTER — Other Ambulatory Visit (HOSPITAL_COMMUNITY): Payer: Self-pay

## 2023-04-15 DIAGNOSIS — E669 Obesity, unspecified: Secondary | ICD-10-CM

## 2023-04-15 MED ORDER — TIRZEPATIDE 2.5 MG/0.5ML ~~LOC~~ SOAJ
2.5000 mg | SUBCUTANEOUS | 0 refills | Status: DC
Start: 2023-04-15 — End: 2023-04-15
  Filled 2023-04-15 (×2): qty 2, 28d supply, fill #0

## 2023-04-15 MED ORDER — WEGOVY 0.25 MG/0.5ML ~~LOC~~ SOAJ
0.2500 mg | SUBCUTANEOUS | 0 refills | Status: DC
Start: 2023-04-15 — End: 2023-05-12

## 2023-04-15 NOTE — Telephone Encounter (Signed)
 Pharmacy Patient Advocate Encounter  Received notification from [employer] that Prior Authorization for [employer] 0.25MG /0.5ML auto-injectors has been APPROVED from 3/8/255 to 04/13/24. Ran test claim, Copay is $43. This test claim was processed through [facility]- copay amounts may vary at other pharmacies due to pharmacy/plan contracts, or as the patient moves through the different stages of their insurance plan.   PA #/Case ID/Reference #: [id number]

## 2023-04-15 NOTE — Telephone Encounter (Signed)
 Patient is aware and would like it sent to the [facility] pharmacy at [facility].

## 2023-04-16 ENCOUNTER — Other Ambulatory Visit (HOSPITAL_COMMUNITY): Payer: Self-pay

## 2023-04-20 ENCOUNTER — Other Ambulatory Visit: Payer: Self-pay | Admitting: Internal Medicine

## 2023-04-20 DIAGNOSIS — J4521 Mild intermittent asthma with (acute) exacerbation: Secondary | ICD-10-CM

## 2023-05-03 ENCOUNTER — Telehealth: Admitting: Physician Assistant

## 2023-05-03 DIAGNOSIS — T7840XA Allergy, unspecified, initial encounter: Secondary | ICD-10-CM | POA: Diagnosis not present

## 2023-05-03 MED ORDER — PREDNISONE 10 MG (21) PO TBPK: ORAL_TABLET | ORAL | 0 refills | Status: DC

## 2023-05-03 NOTE — Patient Instructions (Signed)
 Therman Flatter, thank you for joining Marciana Settle, PA-C for today's virtual visit.  While this provider is not your primary care provider (PCP), if your PCP is located in our provider database this encounter information will be shared with them immediately following your visit.   A [place] [facility] MyChart account gives you access to today's visit and all your visits, tests, and labs performed at [facility] " click here if you don't have a [facility] MyChart account or go to [email or web address]  Consent: (Patient) Brittney [name] provided verbal consent for this virtual visit at the beginning of the encounter.  Current Medications:  Current Outpatient Medications:    albuterol  (PROVENTIL ) (2.5 MG/3ML) 0.083% nebulizer solution, Take 3 mLs (2.5 mg total) by nebulization every 6 (six) hours as needed for wheezing or shortness of breath., Disp: 75 mL, Rfl: 0   albuterol  (VENTOLIN  HFA) 108 (90 Base) MCG/ACT inhaler, INHALE 1-2 PUFFS INTO THE LUNGS EVERY 6 HOURS AS NEEDED., Disp: 6.7 each, Rfl: 2   ALPRAZolam  (XANAX ) 0.5 MG tablet, Take 1 tablet (0.5 mg total) by mouth daily as needed., Disp: 90 tablet, Rfl: 0   atorvastatin  (LIPITOR) 40 MG tablet, Take 1 tablet (40 mg total) by mouth daily., Disp: 90 tablet, Rfl: 1   cetirizine  (ZYRTEC ) 10 MG tablet, Take 1 tablet (10 mg total) by mouth daily., Disp: 90 tablet, Rfl: 1   cyclobenzaprine  (FLEXERIL ) 10 MG tablet, Take 1 tablet (10 mg total) by mouth 2 (two) times daily as needed for muscle spasms., Disp: 20 tablet, Rfl: 0   doxycycline  (VIBRAMYCIN ) 100 MG capsule, Take 1 capsule (100 mg total) by mouth 2 (two) times daily., Disp: 20 capsule, Rfl: 0   EPINEPHrine  0.3 mg/0.3 mL IJ SOAJ injection, Inject 0.3 mg into the muscle as needed for anaphylaxis., Disp: 1 each, Rfl: 0   fluticasone  (FLONASE ) 50 MCG/ACT nasal spray, Place 2 sprays into both nostrils daily. (Patient taking differently: Place 2 sprays into both nostrils as  needed.), Disp: 16 g, Rfl: 0   Multiple Vitamin (MULTIVITAMIN) capsule, Take 1 capsule by mouth daily., Disp: , Rfl:    promethazine -dextromethorphan (PROMETHAZINE -DM) 6.25-15 MG/5ML syrup, Take 5 mLs by mouth 2 (two) times daily as needed for cough., Disp: 118 mL, Rfl: 0   Semaglutide -Weight Management (WEGOVY ) 0.25 MG/0.5ML SOAJ, Inject 0.25 mg into the skin once a week., Disp: 2 mL, Rfl: 0   valACYclovir  (VALTREX ) 1000 MG tablet, Take 1 tablet (1,000 mg total) by mouth daily as needed., Disp: 90 tablet, Rfl: 1   VITAMIN D  PO, Take by mouth daily., Disp: , Rfl:    Medications ordered in this encounter:  No orders of the defined types were placed in this encounter.    *If you need refills on other medications prior to your next appointment, please contact your pharmacy*  Follow-Up: Call back or seek an in-person evaluation if the symptoms worsen or if the condition fails to improve as anticipated.  [facility] [phone]  Other Instructions Please report to the nearest Emergency room with any worsening symptoms. Follow up with primary care provider (PCP) in 2 -3 days.    If you have been instructed to have an in-person evaluation today at a local Urgent Care facility, please use the link below. It will take you to a list of all of our available [facility], including address, phone number and hours of operation. Please do not delay care.  [facility]  If you  or a family member do not have a primary care provider, use the link below to schedule a visit and establish care. When you choose a [facility] primary care physician or advanced practice provider, you gain a long-term partner in health. Find a Primary Care Provider  Learn more about [facility] in-office and virtual care options: [facility] - Get Care Now

## 2023-05-03 NOTE — Progress Notes (Signed)
 Virtual Visit Consent   Chantell Lawry, you are scheduled for a virtual visit with a [facility] provider today. Just as with appointments in the office, your consent must be obtained to participate. Your consent will be active for this visit and any virtual visit you may have with one of our providers in the next 365 days. If you have a MyChart account, a copy of this consent can be sent to you electronically.  As this is a virtual visit, video technology does not allow for your provider to perform a traditional examination. This may limit your provider's ability to fully assess your condition. If your provider identifies any concerns that need to be evaluated in person or the need to arrange testing (such as labs, EKG, etc.), we will make arrangements to do so. Although advances in technology are sophisticated, we cannot ensure that it will always work on either your end or our end. If the connection with a video visit is poor, the visit may have to be switched to a telephone visit. With either a video or telephone visit, we are not always able to ensure that we have a secure connection.  By engaging in this virtual visit, you consent to the provision of healthcare and authorize for your insurance to be billed (if applicable) for the services provided during this visit. Depending on your insurance coverage, you may receive a charge related to this service.  I need to obtain your verbal consent now. Are you willing to proceed with your visit today? Raziah Grelle has provided verbal consent on 05/03/2023 for a virtual visit (video or telephone). Marciana Settle, [place]  Date: 05/03/2023 12:37 PM   Virtual Visit via Video Note   I, Marciana Settle, connected with  Mahalah Montellano  ([id number], [birth year]) on 05/03/23 at 12:30 PM EDT by a video-enabled telemedicine application and verified that I am speaking with the correct person using two identifiers.  Location: Patient: Virtual Visit  Location Patient: Home Provider: Virtual Visit Location Provider: Home Office   I discussed the limitations of evaluation and management by telemedicine and the availability of in person appointments. The patient expressed understanding and agreed to proceed.    History of Present Illness: Karolyne Buechel is a 53 y.o. who identifies as a female who was assigned female at birth, and is being seen today for allergic reaction.  HPI: Allergic Reaction This is a new problem. The current episode started 2 days ago. The problem has been gradually improving since onset. The problem is mild. The time of exposure is not relevant (no exposure). Associated symptoms include a rash. There is no swelling present. Past treatments include one or more OTC medications. The treatment provided mild relief. Her past medical history is significant for food allergies.    Problems:  Patient Active Problem List   Diagnosis Date Noted   Appendicitis 10/20/2020   Acute appendicitis 10/20/2020   COVID-19 virus infection 05/10/2020   Hyperlipidemia 02/24/2020   Obesity (BMI 35.0-39.9 without comorbidity)    Insomnia    Oral herpes    Anemia    Asthma     Allergies:  Allergies  Allergen Reactions   Other     All nuts   Peanut-Containing Drug Products Hives   Sulfa Antibiotics Nausea And Vomiting   Medications:  Current Outpatient Medications:    albuterol  (PROVENTIL ) (2.5 MG/3ML) 0.083% nebulizer solution, Take 3 mLs (2.5 mg total) by nebulization every 6 (six) hours as needed for wheezing or  shortness of breath., Disp: 75 mL, Rfl: 0   albuterol  (VENTOLIN  HFA) 108 (90 Base) MCG/ACT inhaler, INHALE 1-2 PUFFS INTO THE LUNGS EVERY 6 HOURS AS NEEDED., Disp: 6.7 each, Rfl: 2   ALPRAZolam  (XANAX ) 0.5 MG tablet, Take 1 tablet (0.5 mg total) by mouth daily as needed., Disp: 90 tablet, Rfl: 0   atorvastatin  (LIPITOR) 40 MG tablet, Take 1 tablet (40 mg total) by mouth daily., Disp: 90 tablet, Rfl: 1   cetirizine   (ZYRTEC ) 10 MG tablet, Take 1 tablet (10 mg total) by mouth daily., Disp: 90 tablet, Rfl: 1   cyclobenzaprine  (FLEXERIL ) 10 MG tablet, Take 1 tablet (10 mg total) by mouth 2 (two) times daily as needed for muscle spasms., Disp: 20 tablet, Rfl: 0   doxycycline  (VIBRAMYCIN ) 100 MG capsule, Take 1 capsule (100 mg total) by mouth 2 (two) times daily., Disp: 20 capsule, Rfl: 0   EPINEPHrine  0.3 mg/0.3 mL IJ SOAJ injection, Inject 0.3 mg into the muscle as needed for anaphylaxis., Disp: 1 each, Rfl: 0   fluticasone  (FLONASE ) 50 MCG/ACT nasal spray, Place 2 sprays into both nostrils daily. (Patient taking differently: Place 2 sprays into both nostrils as needed.), Disp: 16 g, Rfl: 0   Multiple Vitamin (MULTIVITAMIN) capsule, Take 1 capsule by mouth daily., Disp: , Rfl:    promethazine -dextromethorphan (PROMETHAZINE -DM) 6.25-15 MG/5ML syrup, Take 5 mLs by mouth 2 (two) times daily as needed for cough., Disp: 118 mL, Rfl: 0   Semaglutide -Weight Management (WEGOVY ) 0.25 MG/0.5ML SOAJ, Inject 0.25 mg into the skin once a week., Disp: 2 mL, Rfl: 0   valACYclovir  (VALTREX ) 1000 MG tablet, Take 1 tablet (1,000 mg total) by mouth daily as needed., Disp: 90 tablet, Rfl: 1   VITAMIN D  PO, Take by mouth daily., Disp: , Rfl:   Observations/Objective: Patient is well-developed, well-nourished in no acute distress.  Resting comfortably  at home.  Head is normocephalic, atraumatic.  No labored breathing.  Speech is clear and coherent with logical content.  Patient is alert and oriented at baseline.    Assessment and Plan: 1. Allergic reaction, initial encounter (Primary)  Patient presenting for evaluation of allergic reaction, hives. Exam without major abnormalities, - no hypoxia, no tachycardia, no tachypnea, no hypotension. Diagnosis at this time most consistent with allergic reaction. Differential diagnosis considered including anaphylaxis, angioedema, wound, atopic dermatitis, zoster.  These were thought to be  less likely given the history, exam, and workup. Monitor for new or worsening symptoms. Patient prescribed steroid dosepack. Follow up in one or two days with PCP or report to ER with worsening symptoms.   Follow Up Instructions: I discussed the assessment and treatment plan with the patient. The patient was provided an opportunity to ask questions and all were answered. The patient agreed with the plan and demonstrated an understanding of the instructions.  A copy of instructions were sent to the patient via MyChart unless otherwise noted below.    The patient was advised to call back or seek an in-person evaluation if the symptoms worsen or if the condition fails to improve as anticipated.    Marciana Settle, PA-C

## 2023-05-12 ENCOUNTER — Other Ambulatory Visit (HOSPITAL_BASED_OUTPATIENT_CLINIC_OR_DEPARTMENT_OTHER): Payer: Self-pay

## 2023-05-12 ENCOUNTER — Other Ambulatory Visit: Payer: Self-pay | Admitting: Internal Medicine

## 2023-05-12 ENCOUNTER — Other Ambulatory Visit (HOSPITAL_COMMUNITY): Payer: Self-pay

## 2023-05-12 DIAGNOSIS — E669 Obesity, unspecified: Secondary | ICD-10-CM

## 2023-05-12 MED ORDER — WEGOVY 0.5 MG/0.5ML ~~LOC~~ SOAJ
0.5000 mg | SUBCUTANEOUS | 0 refills | Status: DC
Start: 2023-05-12 — End: 2023-06-10
  Filled 2023-05-12 (×2): qty 2, 28d supply, fill #0

## 2023-05-14 ENCOUNTER — Ambulatory Visit: Admitting: Internal Medicine

## 2023-05-14 ENCOUNTER — Encounter: Payer: Self-pay | Admitting: Internal Medicine

## 2023-05-14 ENCOUNTER — Ambulatory Visit (INDEPENDENT_AMBULATORY_CARE_PROVIDER_SITE_OTHER): Admitting: Internal Medicine

## 2023-05-14 ENCOUNTER — Telehealth: Payer: Self-pay

## 2023-05-14 ENCOUNTER — Other Ambulatory Visit (HOSPITAL_COMMUNITY): Payer: Self-pay

## 2023-05-14 VITALS — BP 124/72 | HR 91 | Temp 97.7°F | Ht 67.0 in | Wt 238.2 lb

## 2023-05-14 DIAGNOSIS — J302 Other seasonal allergic rhinitis: Secondary | ICD-10-CM

## 2023-05-14 DIAGNOSIS — L509 Urticaria, unspecified: Secondary | ICD-10-CM | POA: Diagnosis not present

## 2023-05-14 MED ORDER — CETIRIZINE HCL 10 MG PO TABS: 10.0000 mg | ORAL_TABLET | Freq: Every day | ORAL | 1 refills | Status: DC

## 2023-05-14 MED ORDER — TACROLIMUS 0.1 % EX OINT
TOPICAL_OINTMENT | Freq: Two times a day (BID) | CUTANEOUS | 1 refills | Status: AC
Start: 2023-05-14 — End: ?

## 2023-05-14 NOTE — Telephone Encounter (Signed)
 Pharmacy Patient Advocate Encounter   Received notification from Patient Pharmacy that prior authorization for Tacrolimus 0.1% ointment is required/requested.   Insurance verification completed.   The patient is insured through [employer] .   Per test claim: PA required; PA submitted to above mentioned insurance via CoverMyMeds Key/confirmation #/EOC [facility] Status is pending

## 2023-05-14 NOTE — Telephone Encounter (Signed)
 Pharmacy Patient Advocate Encounter  Received notification from [facility] [employer] that Prior Authorization for Tacrolimus ointment has been DENIED.  Full denial letter will be uploaded to the media tab. See denial reason below.   PA #/Case ID/Reference #: [facility]

## 2023-05-14 NOTE — Progress Notes (Signed)
 Established Patient Office Visit     CC/Reason for Visit: Left eyelid swelling, hives  HPI: Brittney [name] is a 53 y.o. female who is coming in today for the above mentioned reasons. Past Medical History is significant for: Asthma.  Over the last 2 weeks she has had recurrent hives and alternating eyelid swelling.  She has been in [place]  for 5 years.  She has been applying Benadryl  cream and taking Zyrtec  daily.  After an urgent care visit she was given a prednisone  taper which completely resolved her symptoms but her hives returned 3 days later.   Past Medical/Surgical History: Past Medical History:  Diagnosis Date   Allergy    seasonal   Anemia    Anxiety    Asthma    Insomnia    Obesity (BMI 35.0-39.9 without comorbidity)    Oral herpes     Past Surgical History:  Procedure Laterality Date   APPENDECTOMY     BILATERAL SALPINGOOPHORECTOMY     ovaries left   CHOLECYSTECTOMY     COLONOSCOPY     LAPAROSCOPIC APPENDECTOMY N/A 10/20/2020   Procedure: APPENDECTOMY LAPAROSCOPIC;  Surgeon: Kinsinger, Alphonso Aschoff, MD;  Location: [facility] OR;  Service: General;  Laterality: N/A;   lapband     TONSILLECTOMY     tummy tuck      Social History:  reports that she has never smoked. She has never used smokeless tobacco. She reports that she does not currently use alcohol. She reports that she does not use drugs.  Allergies: Allergies  Allergen Reactions   Other     All nuts   Peanut-Containing Drug Products Hives   Sulfa Antibiotics Nausea And Vomiting    Family History:  Family History  Problem Relation Age of Onset   Healthy Mother    Cancer Father    Pancreatic cancer Father    Breast cancer Maternal Grandmother    Colon cancer Neg Hx    Colon polyps Neg Hx    Esophageal cancer Neg Hx    Rectal cancer Neg Hx    Stomach cancer Neg Hx      Current Outpatient Medications:    albuterol  (PROVENTIL ) (2.5 MG/3ML) 0.083% nebulizer solution, Take 3 mLs (2.5  mg total) by nebulization every 6 (six) hours as needed for wheezing or shortness of breath., Disp: 75 mL, Rfl: 0   albuterol  (VENTOLIN  HFA) 108 (90 Base) MCG/ACT inhaler, INHALE 1-2 PUFFS INTO THE LUNGS EVERY 6 HOURS AS NEEDED., Disp: 6.7 each, Rfl: 2   ALPRAZolam  (XANAX ) 0.5 MG tablet, Take 1 tablet (0.5 mg total) by mouth daily as needed., Disp: 90 tablet, Rfl: 0   atorvastatin  (LIPITOR) 40 MG tablet, Take 1 tablet (40 mg total) by mouth daily., Disp: 90 tablet, Rfl: 1   cetirizine  (ZYRTEC ) 10 MG tablet, Take 1 tablet (10 mg total) by mouth daily., Disp: 90 tablet, Rfl: 1   cyclobenzaprine  (FLEXERIL ) 10 MG tablet, Take 1 tablet (10 mg total) by mouth 2 (two) times daily as needed for muscle spasms., Disp: 20 tablet, Rfl: 0   EPINEPHrine  0.3 mg/0.3 mL IJ SOAJ injection, Inject 0.3 mg into the muscle as needed for anaphylaxis., Disp: 1 each, Rfl: 0   fluticasone  (FLONASE ) 50 MCG/ACT nasal spray, Place 2 sprays into both nostrils daily. (Patient taking differently: Place 2 sprays into both nostrils as needed.), Disp: 16 g, Rfl: 0   Multiple Vitamin (MULTIVITAMIN) capsule, Take 1 capsule by mouth daily., Disp: , Rfl:  Semaglutide -Weight Management (WEGOVY ) 0.5 MG/0.5ML SOAJ, Inject 0.5 mg into the skin once a week., Disp: 2 mL, Rfl: 0   tacrolimus (PROTOPIC) 0.1 % ointment, Apply topically 2 (two) times daily., Disp: 100 g, Rfl: 1   valACYclovir  (VALTREX ) 1000 MG tablet, Take 1 tablet (1,000 mg total) by mouth daily as needed., Disp: 90 tablet, Rfl: 1   VITAMIN D  PO, Take by mouth daily., Disp: , Rfl:   Review of Systems:  Negative unless indicated in HPI.   Physical Exam: Vitals:   05/14/23 0719  BP: 124/72  Pulse: 91  Temp: 97.7 F (36.5 C)  TempSrc: Oral  SpO2: 100%  Weight: 238 lb 3.2 oz (108 kg)  Height: 5\' 7"  (1.702 m)    Body mass index is 37.31 kg/m.     Impression and Plan:  Seasonal allergies -     Cetirizine  HCl; Take 1 tablet (10 mg total) by mouth daily.   Dispense: 90 tablet; Refill: 1 -     Tacrolimus; Apply topically 2 (two) times daily.  Dispense: 100 g; Refill: 1 -     Ambulatory referral to Allergy  Hives -     Ambulatory referral to Allergy   - Will refer to allergist for skin patch testing and consideration of allergy immunizations. - Will give her tacrolimus ointment for her eyes, advised Pataday eyedrops, for now have advised that she increase Zyrtec  to twice daily.  Time spent:30 minutes reviewing chart, interviewing and examining patient and formulating plan of care.     Marguerita Shih, MD [facility]

## 2023-05-15 ENCOUNTER — Encounter (HOSPITAL_BASED_OUTPATIENT_CLINIC_OR_DEPARTMENT_OTHER): Payer: Self-pay | Admitting: Emergency Medicine

## 2023-05-15 ENCOUNTER — Emergency Department (HOSPITAL_BASED_OUTPATIENT_CLINIC_OR_DEPARTMENT_OTHER)
Admission: EM | Admit: 2023-05-15 | Discharge: 2023-05-15 | Disposition: A | Discharge status: Home / Self Care | Attending: Emergency Medicine | Admitting: Emergency Medicine

## 2023-05-15 ENCOUNTER — Emergency Department (HOSPITAL_BASED_OUTPATIENT_CLINIC_OR_DEPARTMENT_OTHER)

## 2023-05-15 ENCOUNTER — Ambulatory Visit: Admitting: Internal Medicine

## 2023-05-15 ENCOUNTER — Other Ambulatory Visit: Payer: Self-pay

## 2023-05-15 DIAGNOSIS — T7840XA Allergy, unspecified, initial encounter: Secondary | ICD-10-CM | POA: Diagnosis not present

## 2023-05-15 DIAGNOSIS — Z91010 Allergy to peanuts: Secondary | ICD-10-CM | POA: Diagnosis not present

## 2023-05-15 DIAGNOSIS — T783XXA Angioneurotic edema, initial encounter: Secondary | ICD-10-CM | POA: Insufficient documentation

## 2023-05-15 DIAGNOSIS — R21 Rash and other nonspecific skin eruption: Secondary | ICD-10-CM | POA: Diagnosis present

## 2023-05-15 LAB — COMPREHENSIVE METABOLIC PANEL WITH GFR
ALT: 15 U/L (ref 0–44)
AST: 19 U/L (ref 15–41)
Albumin: 4.4 g/dL (ref 3.5–5.0)
Alkaline Phosphatase: 87 U/L (ref 38–126)
Anion gap: 11 (ref 5–15)
BUN: 17 mg/dL (ref 6–20)
CO2: 25 mmol/L (ref 22–32)
Calcium: 9.5 mg/dL (ref 8.9–10.3)
Chloride: 104 mmol/L (ref 98–111)
Creatinine, Ser: 0.8 mg/dL (ref 0.44–1.00)
GFR, Estimated: >60 mL/min (ref >60)
Glucose, Bld: 86 mg/dL (ref 70–99)
Potassium: 3.9 mmol/L (ref 3.5–5.1)
Sodium: 140 mmol/L (ref 135–145)
Total Bilirubin: 0.6 mg/dL (ref 0.0–1.2)
Total Protein: 6.9 g/dL (ref 6.5–8.1)

## 2023-05-15 LAB — CBC
HCT: 41.3 % (ref 36.0–46.0)
Hemoglobin: 13.6 g/dL (ref 12.0–15.0)
MCH: 29.8 pg (ref 26.0–34.0)
MCHC: 32.9 g/dL (ref 30.0–36.0)
MCV: 90.4 fL (ref 80.0–100.0)
Platelets: 227 10*3/uL (ref 150–400)
RBC: 4.57 MIL/uL (ref 3.87–5.11)
RDW: 13 % (ref 11.5–15.5)
WBC: 7.6 10*3/uL (ref 4.0–10.5)
nRBC: 0 % (ref 0.0–0.2)

## 2023-05-15 LAB — DIFFERENTIAL
Abs Immature Granulocytes: 0.03 10*3/uL (ref 0.00–0.07)
Basophils Absolute: 0 10*3/uL (ref 0.0–0.1)
Basophils Relative: 0 %
Eosinophils Absolute: 0.2 10*3/uL (ref 0.0–0.5)
Eosinophils Relative: 2 %
Immature Granulocytes: 0 %
Lymphocytes Relative: 23 %
Lymphs Abs: 1.8 10*3/uL (ref 0.7–4.0)
Monocytes Absolute: 0.5 10*3/uL (ref 0.1–1.0)
Monocytes Relative: 7 %
Neutro Abs: 5.1 10*3/uL (ref 1.7–7.7)
Neutrophils Relative %: 68 %

## 2023-05-15 LAB — APTT: aPTT: 29 s (ref 24–36)

## 2023-05-15 LAB — URINE DRUG SCREEN
Amphetamines: NOT DETECTED
Barbiturates: NOT DETECTED
Benzodiazepines: NOT DETECTED
Cocaine: NOT DETECTED
Fentanyl: NOT DETECTED
Methadone Scn, Ur: NOT DETECTED
Opiates: NOT DETECTED
Tetrahydrocannabinol: NOT DETECTED

## 2023-05-15 LAB — PROTIME-INR
INR: 0.9 (ref 0.8–1.2)
Prothrombin Time: 12.4 s (ref 11.4–15.2)

## 2023-05-15 LAB — ETHANOL: Alcohol, Ethyl (B): <15 mg/dL (ref <15)

## 2023-05-15 LAB — PREGNANCY, URINE: Preg Test, Ur: NEGATIVE

## 2023-05-15 LAB — CBG MONITORING, ED: Glucose-Capillary: 78 mg/dL (ref 70–99)

## 2023-05-15 MED ORDER — ACETAMINOPHEN 500 MG PO TABS
1000.0000 mg | ORAL_TABLET | Freq: Once | ORAL | Status: AC
  Administered 2023-05-15: 1000 mg via ORAL
  Filled 2023-05-15: qty 2

## 2023-05-15 MED ORDER — DEXAMETHASONE SODIUM PHOSPHATE 10 MG/ML IJ SOLN
10.0000 mg | Freq: Once | INTRAMUSCULAR | Status: AC
  Administered 2023-05-15: 10 mg via INTRAVENOUS
  Filled 2023-05-15: qty 1

## 2023-05-15 MED ORDER — PREDNISONE 20 MG PO TABS: ORAL_TABLET | ORAL | 0 refills | Status: DC

## 2023-05-15 MED ORDER — EPINEPHRINE 0.3 MG/0.3ML IJ SOAJ: 0.3000 mg | INTRAMUSCULAR | 0 refills | Status: DC | PRN

## 2023-05-15 MED ORDER — FAMOTIDINE IN NACL 20-0.9 MG/50ML-% IV SOLN
20.0000 mg | Freq: Once | INTRAVENOUS | Status: AC
  Administered 2023-05-15: 20 mg via INTRAVENOUS
  Filled 2023-05-15: qty 50

## 2023-05-15 MED ORDER — FAMOTIDINE 20 MG PO TABS: 20.0000 mg | ORAL_TABLET | Freq: Two times a day (BID) | ORAL | 0 refills | Status: DC

## 2023-05-15 MED ORDER — DIPHENHYDRAMINE HCL 50 MG/ML IJ SOLN
25.0000 mg | Freq: Once | INTRAMUSCULAR | Status: AC
  Administered 2023-05-15: 25 mg via INTRAVENOUS
  Filled 2023-05-15: qty 1

## 2023-05-15 NOTE — Consult Note (Signed)
 NEUROLOGY TELESTROKE CONSULT NOTE   Date of service: May 15, 2023 Patient Name: Brittney [name] MRN:  [id number] DOB:  Nov 28, 1970 Chief Complaint: asymmetric smile Requesting Provider: Hershel Los, MD  Consult Participants: myself, patient, bedside RN, telestroke RN Location of the provider: [facility] Location of the patient: [facility]  This consult was provided via telemedicine with 2-way video and audio communication. The patient/family was informed that care would be provided in this way and agreed to receive care in this manner.   History of Present Illness   This is a 53 year old woman with past medical history significant for allergies and asthma on him stroke code was called for asymmetric smile.  She reports that she has been having a "allergic reaction" for a few weeks with itching and numbness around her lips.  This morning she states she woke up with symptoms that she felt numb in her left face as well as her left hand.  On tele examination she has visible swelling of the left lip, left face, and left hand.  She does not have numbness outside of the distribution of visible swelling.  Her asymmetric smile is not felt to be out of proportion to the degree of swelling of her lip and left face.  TNK was not administered due to presentation outside the window and very low suspicion for stroke symptoms given that her only deficits were mild and in the diffuse distribution of the aforementioned swelling.  Suspect deficits secondary to allergic reaction.   LKW: last night 10:30pm prior to going to bed Modified rankin score: 0-Completely asymptomatic and back to baseline post- stroke IV Thrombolysis: no, outside window, low suspicion for stroke  NIHSS components Score: Comment  1a Level of Conscious 0[]  1[]  2[]  3[]      1b LOC Questions 0[]  1[]  2[]       1c LOC Commands 0[]  1[]  2[]       2 Best Gaze 0[]  1[]  2[]       3 Visual 0[]  1[]  2[]  3[]      4 Facial Palsy 0[]  1[x]  2[]  3[]      5a Motor Arm  - left 0[]  1[]  2[]  3[]  4[]  UN[]    5b Motor Arm - Right 0[]  1[]  2[]  3[]  4[]  UN[]    6a Motor Leg - Left 0[]  1[]  2[]  3[]  4[]  UN[]    6b Motor Leg - Right 0[]  1[]  2[]  3[]  4[]  UN[]    7 Limb Ataxia 0[]  1[]  2[]  3[]  UN[]     8 Sensory 0[]  1[x]  2[]  UN[]      9 Best Language 0[]  1[]  2[]  3[]      10 Dysarthria 0[]  1[]  2[]  UN[]      11 Extinct. and Inattention 0[]  1[]  2[]       TOTAL:  2       ROS   Comprehensive ROS performed and pertinent positives documented in HPI   Past History   Past Medical History:  Diagnosis Date   Allergy    seasonal   Anemia    Anxiety    Asthma    Insomnia    Obesity (BMI 35.0-39.9 without comorbidity)    Oral herpes     Past Surgical History:  Procedure Laterality Date   APPENDECTOMY     BILATERAL SALPINGOOPHORECTOMY     ovaries left   CHOLECYSTECTOMY     COLONOSCOPY     LAPAROSCOPIC APPENDECTOMY N/A 10/20/2020   Procedure: APPENDECTOMY LAPAROSCOPIC;  Surgeon: Kinsinger, Alphonso Aschoff, MD;  Location: [facility] OR;  Service: General;  Laterality: N/A;   lapband  TONSILLECTOMY     tummy tuck      Family History: Family History  Problem Relation Age of Onset   Healthy Mother    Cancer Father    Pancreatic cancer Father    Breast cancer Maternal Grandmother    Colon cancer Neg Hx    Colon polyps Neg Hx    Esophageal cancer Neg Hx    Rectal cancer Neg Hx    Stomach cancer Neg Hx     Social History  reports that she has never smoked. She has never used smokeless tobacco. She reports that she does not currently use alcohol. She reports that she does not use drugs.  Allergies  Allergen Reactions   Other     All nuts   Peanut-Containing Drug Products Hives   Sulfa Antibiotics Nausea And Vomiting    Medications  No current facility-administered medications for this encounter.  Current Outpatient Medications:    albuterol  (PROVENTIL ) (2.5 MG/3ML) 0.083% nebulizer solution, Take 3 mLs (2.5 mg total) by nebulization every 6 (six) hours as needed for  wheezing or shortness of breath., Disp: 75 mL, Rfl: 0   albuterol  (VENTOLIN  HFA) 108 (90 Base) MCG/ACT inhaler, INHALE 1-2 PUFFS INTO THE LUNGS EVERY 6 HOURS AS NEEDED., Disp: 6.7 each, Rfl: 2   ALPRAZolam  (XANAX ) 0.5 MG tablet, Take 1 tablet (0.5 mg total) by mouth daily as needed., Disp: 90 tablet, Rfl: 0   atorvastatin  (LIPITOR) 40 MG tablet, Take 1 tablet (40 mg total) by mouth daily., Disp: 90 tablet, Rfl: 1   cetirizine  (ZYRTEC ) 10 MG tablet, Take 1 tablet (10 mg total) by mouth daily., Disp: 90 tablet, Rfl: 1   cyclobenzaprine  (FLEXERIL ) 10 MG tablet, Take 1 tablet (10 mg total) by mouth 2 (two) times daily as needed for muscle spasms., Disp: 20 tablet, Rfl: 0   EPINEPHrine  0.3 mg/0.3 mL IJ SOAJ injection, Inject 0.3 mg into the muscle as needed for anaphylaxis., Disp: 1 each, Rfl: 0   fluticasone  (FLONASE ) 50 MCG/ACT nasal spray, Place 2 sprays into both nostrils daily. (Patient taking differently: Place 2 sprays into both nostrils as needed.), Disp: 16 g, Rfl: 0   Multiple Vitamin (MULTIVITAMIN) capsule, Take 1 capsule by mouth daily., Disp: , Rfl:    Semaglutide -Weight Management (WEGOVY ) 0.5 MG/0.5ML SOAJ, Inject 0.5 mg into the skin once a week., Disp: 2 mL, Rfl: 0   tacrolimus (PROTOPIC) 0.1 % ointment, Apply topically 2 (two) times daily., Disp: 100 g, Rfl: 1   valACYclovir  (VALTREX ) 1000 MG tablet, Take 1 tablet (1,000 mg total) by mouth daily as needed., Disp: 90 tablet, Rfl: 1   VITAMIN D  PO, Take by mouth daily., Disp: , Rfl:   Vitals   Vitals:   05/15/23 0910 05/15/23 0917  BP: 132/81   Pulse: 95   Resp: 20   Temp:  98 F (36.7 C)  TempSrc:  Oral  SpO2: 100%     There is no height or weight on file to calculate BMI.  Physical Exam   Physical Exam Gen: A&O x4, NAD Resp: normal WOB CV: extremities appear well-perfused  Neurologic Examination   Neuro: *MS: A&O x4. Follows multi-step commands.  *Speech: nondysarthric, no aphasia, able to name and repeat *CN:  PERRL 3mm, EOMI, VFF by confrontation, sensation impaired to L side, visible swelling of L lip and L face, asymmetric smile not out of proportion to swelling, hearing intact to voice *Motor:   Normal bulk.  No tremor, rigidity or bradykinesia. No pronator drift.  All extremities appear full-strength and symmetric. Slightly weaker L grip strength with visible swelling of L hand *Sensory: Sensory deficit to L hand over region of visible swelling *Coordination:  Finger-to-nose, heel-to-shin, rapid alternating motions were intact. *Reflexes:  UTA 2/2 tele-exam *Gait: deferred  Labs/Imaging/Neurodiagnostic studies   CBC:  Recent Labs  Lab 24-May-2023 0944  WBC 7.6  NEUTROABS 5.1  HGB 13.6  HCT 41.3  MCV 90.4  PLT 227   Basic Metabolic Panel:  Lab Results  Component Value Date   NA 140 05/24/23   K 3.9 May 24, 2023   CO2 25 05-24-23   GLUCOSE 86 May 24, 2023   BUN 17 05-24-23   CREATININE 0.80 05/24/2023   CALCIUM  9.5 05/24/2023   GFRNONAA >60 2023/05/24   Lipid Panel:  Lab Results  Component Value Date   LDLCALC 139 (H) 02/12/2023   HgbA1c:  Lab Results  Component Value Date   HGBA1C 5.8 02/12/2023   Urine Drug Screen:     Component Value Date/Time   LABOPIA NONE DETECTED 05-24-23 0944   COCAINSCRNUR NONE DETECTED 05-24-23 0944   LABBENZ NONE DETECTED 05-24-23 0944   AMPHETMU NONE DETECTED 2023-05-24 0944   THCU NONE DETECTED May 24, 2023 0944   LABBARB NONE DETECTED 05/24/2023 0944    Alcohol Level     Component Value Date/Time   ETH <15 05-24-2023 0944   INR No results found for: "INR" APTT No results found for: "APTT" AED levels: No results found for: "PHENYTOIN", "ZONISAMIDE", "LAMOTRIGINE", "LEVETIRACETA"  CT Head without contrast(Personally reviewed): No acute process  ASSESSMENT   This is a 53 year old woman with past medical history significant for allergies and asthma on him stroke code was called for asymmetric smile.  On tele examination she  has visible swelling of the left lip, left face, and left hand.  She does not have numbness outside of the distribution of visible swelling.  Her asymmetric smile is not felt to be out of proportion to the degree of swelling of her lip and left face.  TNK was not administered due to presentation outside the window and very low suspicion for stroke symptoms given that her only deficits were mild and in the diffuse distribution of the aforementioned swelling.  Suspect deficits secondary to allergic reaction.  RECOMMENDATIONS   - No further stroke workup indicated - Neurology will be available prn for questions ______________________________________________________________________    Signed, Eleni Griffin, MD [facility]

## 2023-05-15 NOTE — ED Notes (Signed)
 0920 activation with exam completed prior to joining cart.  RN reports pt presented reporting possible allergic rx over last week waking this am with facial swelling and possible facial droop.  States spouse saw her at 7am in normal state of health.  [phone] Page to neuro 0927 Dr Doretta Gant joins cart.

## 2023-05-15 NOTE — ED Notes (Signed)
 Pts husband now states he saw her 0715 this am when he was leaving house and was normal.

## 2023-05-15 NOTE — ED Triage Notes (Signed)
 Left hand is now feeling tingly,  RN can't differentiate between facial swelling or facial droop, MD called to room and here now.   Pt last seen 1030 pm normal, awoke at 0800 with symptoms.

## 2023-05-15 NOTE — Discharge Instructions (Addendum)
 Continue taking Zyrtec .  Add Pepcid .  Start taking the prednisone  tomorrow once a day for 4 more days.  Follow-up with your primary care doctor or the allergist as soon as possible.  Return to the emergency room if you have any worsening symptoms.

## 2023-05-15 NOTE — ED Notes (Signed)
 Dc instructions reviewed with patient. Patient voiced understanding. Dc with belongings.

## 2023-05-15 NOTE — ED Provider Notes (Signed)
 [facility] EMERGENCY DEPARTMENT AT [facility] Provider Note   CSN: [id number] Arrival date & time: 05/15/23  4098  An emergency department physician performed an initial assessment on this suspected stroke patient at 0912 (Ed physician arrived and perfomed assessment).  History  Chief Complaint  Patient presents with   Allergic Reaction    Brittney [name] is a 53 y.o. female.  Patient is a 53 year old female who presents with facial drooping.  She states that over the last 2 weeks she has had a rash.  She says it is itchy.  She has been treated for possible allergic reaction by her doctors.  She has been on Zyrtec  and took a course of prednisone .  She recently has increased her Zyrtec  to twice a day.  She woke up this morning and noticed that her left face was drooping and feels a little swollen.  She denies any trouble with her speech or vision.  She denies any difficulty swallowing.  She has some tingling in her left hand as well.  She says her left hand feels a little more swollen.  She denies any weakness of her arm or leg.  Initially she said that the last time she knew her face was normal was last night at 10 PM when she went to bed.  However then her husband said he said goodbye to her about 715 this morning and she did not have any facial drooping or facial abnormality when he saw her at that time.       Home Medications Prior to Admission medications   Medication Sig Start Date End Date Taking? Authorizing Provider  EPINEPHrine  0.3 mg/0.3 mL IJ SOAJ injection Inject 0.3 mg into the muscle as needed for anaphylaxis. 05/15/23  Yes Hershel Los, MD  famotidine  (PEPCID ) 20 MG tablet Take 1 tablet (20 mg total) by mouth 2 (two) times daily. 05/15/23  Yes Hershel Los, MD  predniSONE  (DELTASONE ) 20 MG tablet 2 tabs po daily x 4 days 05/15/23  Yes Hershel Los, MD  albuterol  (PROVENTIL ) (2.5 MG/3ML) 0.083% nebulizer solution Take 3 mLs (2.5 mg total) by nebulization every  6 (six) hours as needed for wheezing or shortness of breath. 01/17/23   Raspet, Erin K, PA-C  albuterol  (VENTOLIN  HFA) 108 (90 Base) MCG/ACT inhaler INHALE 1-2 PUFFS INTO THE LUNGS EVERY 6 HOURS AS NEEDED. 04/21/23   Zilphia Hilt, Charyl Coppersmith, MD  ALPRAZolam  (XANAX ) 0.5 MG tablet Take 1 tablet (0.5 mg total) by mouth daily as needed. 03/14/22   Zilphia Hilt, Charyl Coppersmith, MD  atorvastatin  (LIPITOR) 40 MG tablet Take 1 tablet (40 mg total) by mouth daily. 04/30/22   Zilphia Hilt, Charyl Coppersmith, MD  cetirizine  (ZYRTEC ) 10 MG tablet Take 1 tablet (10 mg total) by mouth daily. 05/14/23   Zilphia Hilt, Charyl Coppersmith, MD  cyclobenzaprine  (FLEXERIL ) 10 MG tablet Take 1 tablet (10 mg total) by mouth 2 (two) times daily as needed for muscle spasms. 07/24/22   Mesner, Reymundo Caulk, MD  EPINEPHrine  0.3 mg/0.3 mL IJ SOAJ injection Inject 0.3 mg into the muscle as needed for anaphylaxis. 06/12/21   Sueellen Emery, MD  fluticasone  (FLONASE ) 50 MCG/ACT nasal spray Place 2 sprays into both nostrils daily. Patient taking differently: Place 2 sprays into both nostrils as needed. 12/19/20   Farris Hong, PA-C  Multiple Vitamin (MULTIVITAMIN) capsule Take 1 capsule by mouth daily.    [provider]  Semaglutide -Weight Management (WEGOVY ) 0.5 MG/0.5ML SOAJ Inject 0.5 mg into the skin once a week.  05/12/23   Zilphia Hilt, Charyl Coppersmith, MD  tacrolimus (PROTOPIC) 0.1 % ointment Apply topically 2 (two) times daily. 05/14/23   Zilphia Hilt, Charyl Coppersmith, MD  valACYclovir  (VALTREX ) 1000 MG tablet Take 1 tablet (1,000 mg total) by mouth daily as needed. 04/03/23   Zilphia Hilt, Charyl Coppersmith, MD  VITAMIN D  PO Take by mouth daily.    [provider]      Allergies    Other, Peanut-containing drug products, and Sulfa antibiotics    Review of Systems   Review of Systems  Constitutional:  Negative for chills, diaphoresis, fatigue and fever.  HENT:  Positive for facial swelling. Negative for congestion, rhinorrhea and  sneezing.   Eyes: Negative.   Respiratory:  Negative for cough, chest tightness and shortness of breath.   Cardiovascular:  Negative for chest pain and leg swelling.  Gastrointestinal:  Negative for abdominal pain, blood in stool, diarrhea, nausea and vomiting.  Genitourinary:  Negative for difficulty urinating, flank pain, frequency and hematuria.  Musculoskeletal:  Negative for arthralgias and back pain.  Skin:  Positive for rash.  Neurological:  Positive for facial asymmetry and numbness. Negative for dizziness, speech difficulty, weakness and headaches.    Physical Exam Updated Vital Signs BP 109/70   Pulse 71   Temp 98 F (36.7 C) (Oral)   Resp 15   SpO2 100%  Physical Exam Constitutional:      Appearance: She is well-developed.  HENT:     Head: Normocephalic and atraumatic.     Mouth/Throat:     Comments: Mild swelling of the left upper lip.  No other evidence of intraoral swelling/angioedema.  No rashes to the face. Eyes:     Pupils: Pupils are equal, round, and reactive to light.  Cardiovascular:     Rate and Rhythm: Normal rate and regular rhythm.     Heart sounds: Normal heart sounds.  Pulmonary:     Effort: Pulmonary effort is normal. No respiratory distress.     Breath sounds: Normal breath sounds. No wheezing or rales.  Chest:     Chest wall: No tenderness.  Abdominal:     General: Bowel sounds are normal.     Palpations: Abdomen is soft.     Tenderness: There is no abdominal tenderness. There is no guarding or rebound.  Musculoskeletal:        General: Normal range of motion.     Cervical back: Normal range of motion and neck supple.  Lymphadenopathy:     Cervical: No cervical adenopathy.  Skin:    General: Skin is warm and dry.     Findings: No rash.  Neurological:     Mental Status: She is alert and oriented to person, place, and time.     Comments: Patient has some mild swelling to the left upper lip.  The left side of face does appear drooping.  She  has normal opening and closure of the eyelid.  No rashes on the face.  She has some diminished sensation to light touch in the left face as compared to the right.  She has some diminished sensation in the right arm as compared to the left.  Grip strength is symmetric.  She has normal sensation and motor function in the lower extremities bilaterally.  Visual fields full to confrontation.     ED Results / Procedures / Treatments   Labs (all labs ordered are listed, but only abnormal results are displayed) Labs Reviewed  ETHANOL  CBC  DIFFERENTIAL  COMPREHENSIVE METABOLIC PANEL WITH GFR  URINE DRUG SCREEN  PREGNANCY, URINE  PROTIME-INR  APTT  CBG MONITORING, ED    EKG EKG Interpretation Date/Time:  Thursday May 15 2023 09:38:03 EDT Ventricular Rate:  78 PR Interval:  141 QRS Duration:  94 QT Interval:  390 QTC Calculation: 445 R Axis:   41  Text Interpretation: Sinus rhythm Low voltage, precordial leads No old tracing to compare Confirmed by Hershel Los [phone]) on 05/15/2023 9:40:12 AM  Radiology CT HEAD CODE STROKE WO CONTRAST Result Date: 05/15/2023 CLINICAL DATA:  Code stroke. Left-sided face swelling and left hand tingling. EXAM: CT HEAD WITHOUT CONTRAST TECHNIQUE: Contiguous axial images were obtained from the base of the skull through the vertex without intravenous contrast. RADIATION DOSE REDUCTION: This exam was performed according to the departmental dose-optimization program which includes automated exposure control, adjustment of the mA and/or kV according to patient size and/or use of iterative reconstruction technique. COMPARISON:  07/23/2022 FINDINGS: Brain: No evidence of acute infarction, hemorrhage, hydrocephalus, extra-axial collection or mass lesion/mass effect. Vascular: No hyperdense vessel or unexpected calcification. Skull: Normal. Negative for fracture or focal lesion. Sinuses/Orbits: No acute finding. ASPECTS [facility] Stroke Program Early CT Score) - Ganglionic  level infarction (caudate, lentiform nuclei, internal capsule, insula, M1-M3 cortex): 7 - Supraganglionic infarction (M4-M6 cortex): 3 Total score (0-10 with 10 being normal): 10 IMPRESSION: 1. Negative head CT. 2. ASPECTS is 10 Electronically Signed   By: Ronnette Coke M.D.   On: 05/15/2023 09:37    Procedures Procedures    Medications Ordered in ED Medications  dexamethasone  (DECADRON ) injection 10 mg (10 mg Intravenous Given 05/15/23 1004)  famotidine  (PEPCID ) IVPB 20 mg premix (20 mg Intravenous New Bag/Given 05/15/23 1010)  diphenhydrAMINE  (BENADRYL ) injection 25 mg (25 mg Intravenous Given 05/15/23 1004)  acetaminophen  (TYLENOL ) tablet 1,000 mg (1,000 mg Oral Given 05/15/23 1031)    ED Course/ Medical Decision Making/ A&P                                 Medical Decision Making Amount and/or Complexity of Data Reviewed Labs: ordered. Radiology: ordered.  Risk OTC drugs. Prescription drug management.   Patient is a 53 year old who has been dealing with a rash for the last couple of weeks.  The rash is consistent with urticaria.  It is blanching.  It does not have characteristics of other things such as [facility] spotted fever or shingles.  She woke up this morning and noticed some left-sided facial drooping.  She has a little bit of swelling to the left upper lip.  She has some numbness in her face and her arm.  Initially code stroke was activated due to the facial drooping.  Neurologist evaluated patient and feels that this is likely related to the facial swelling and is not concerned about an acute stroke.  She did have a head CT which did not show any acute abnormality.  It was not felt that further stroke workup was indicated.  I do agree that her symptoms are more consistent with possible angioedema.  She does not have evidence of tongue swelling or other intraoral swelling.  She was given Decadron , Benadryl  and Pepcid .  She was monitored for over 2 hours and her symptoms have  improved.  She has not had any worsening symptoms.  Her labs are reviewed and are nonconcerning.  She was discharged home in good condition.  Will add prednisone  for the  next 4 days.  Advised her to add Pepcid  to her Zyrtec .  Will also give her a prescription for an EpiPen  and gave her indications and instructions on use.  She has recently been referred to follow-up with an allergist.  She is awaiting appointment.  Advised to have close follow-up with her primary care doctor or allergist.  Return precautions were given.  CRITICAL CARE Performed by: Hershel Los Total critical care time: 60 minutes Critical care time was exclusive of separately billable procedures and treating other patients. Critical care was necessary to treat or prevent imminent or life-threatening deterioration. Critical care was time spent personally by me on the following activities: development of treatment plan with patient and/or surrogate as well as nursing, discussions with consultants, evaluation of patient's response to treatment, examination of patient, obtaining history from patient or surrogate, ordering and performing treatments and interventions, ordering and review of laboratory studies, ordering and review of radiographic studies, pulse oximetry and re-evaluation of patient's condition.   Final Clinical Impression(s) / ED Diagnoses Final diagnoses:  Angioedema, initial encounter    Rx / DC Orders ED Discharge Orders          Ordered    predniSONE  (DELTASONE ) 20 MG tablet        05/15/23 1138    famotidine  (PEPCID ) 20 MG tablet  2 times daily        05/15/23 1138    EPINEPHrine  0.3 mg/0.3 mL IJ SOAJ injection  As needed        05/15/23 1138              Hershel Los, MD 05/15/23 1142

## 2023-05-15 NOTE — ED Triage Notes (Signed)
 Pt visited [facility]. She returned home and the next day she noticed welps on her arms/torso. Has progressively worsened, has to been to several doctors, tried zyrtec  and prednisone . No better . This am she awoke with left facial swelling and left hand swelling and now states harder time breathing and swallowing.

## 2023-05-16 ENCOUNTER — Ambulatory Visit: Payer: Self-pay | Admitting: *Deleted

## 2023-05-16 NOTE — Telephone Encounter (Signed)
  Chief Complaint: left eye swelling again since yesterday after being seen in ED for allergic reaction. Requesting to see PCP today . Symptoms: left eye swelling again this am and tender to touch.  Chest heaviness at times and reports not enough to go to ED like sx from yesterday. C/o not able to sleep. Taking all prescribed medications and swelling returned. Patient reports she has only ate eggs today but has never been allergic to eggs before. 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 See ED visit from allergic reaction and facial swelling.  Frequency: today Pertinent Negatives: Patient denies chest pain now no difficulty breathing no fever no rash  Disposition: [] ED /[] Urgent Care (no appt availability in office) / [] Appointment(In office/virtual)/ []  [facility]/ [] Home Care/ [x] Refused Recommended Disposition /[] [facility]/ []  Follow-up with PCP Additional Notes:   Recommended OV with PCP and none available until Monday. Patient requesting is PCP can work her in today to f/u on medications to take for allergic reaction. Recommended if sx return go to ED. Referral for allergist appt not until July. Patient requesting call back ASAP please.       Copied from CRM [id number]. Topic: Clinical - Red Word Triage >> May 16, 2023  9:05 AM Clydene Darner H wrote: Red Word that prompted transfer to Nurse Triage: Patient called requesting a same-day appointment for today. She reported that she was recently seen in the ER due to symptoms concerning for a possible stroke. Currently, she is experiencing facial and eye swelling, puffiness in her left hand puffy, and hives under her armpits, on her legs, and behind her knees. Reason for Disposition  Face swelling is painful to touch  Answer Assessment - Initial Assessment Questions 1. ONSET: "When did the swelling start?" (e.g., minutes, hours, days)     Yesterday and today left eye swelling again not whole face  2. LOCATION: "What part of the face is swollen?"      Left face around at eye  3. SEVERITY: "How swollen is it?"     Not enough swelling to close eyes  4. ITCHING: "Is there any itching?" If Yes, ask: "How much?"   (Scale 1-10; mild, moderate or severe)     na 5. PAIN: "Is the swelling painful to touch?" If Yes, ask: "How painful is it?"   (Scale 1-10; mild, moderate or severe)   - NONE (0): no pain   - MILD (1-3): doesn't interfere with normal activities    - MODERATE (4-7): interferes with normal activities or awakens from sleep    - SEVERE (8-10): excruciating pain, unable to do any normal activities      Not sleeping left eye tender 6. FEVER: "Do you have a fever?" If Yes, ask: "What is it, how was it measured, and when did it start?"      na 7. CAUSE: "What do you think is causing the face swelling?"     Not sure  8. RECURRENT SYMPTOM: "Have you had face swelling before?" If Yes, ask: "When was the last time?" "What happened that time?"     Yes  9. OTHER SYMPTOMS: "Do you have any other symptoms?" (e.g., toothache, leg swelling)     Left eye swelling again this am. Chest heavy at times but denies enough to go to ED. 10. PREGNANCY: "Is there any chance you are pregnant?" "When was your last menstrual period?"       na  Protocols used: Face Swelling-A-AH

## 2023-05-16 NOTE — Telephone Encounter (Signed)
 I spoke with the patient and advised she go to the ER.  Patient began to talk loudly and stated she already went to the ER yesterday, waited 4 hours, they thought she had a stroke, is on 5 different medications, still not better, face is swollen and knows she is having an allergic reaction.  I again advised the patient go back to the ER or an urgent care for evaluation and Dr Ival Marines would suggest the same treatment.  Patient questioned why I called her so late to tell her this, stated "God forbid if she had a God---- stroke" and was advised I was reviewing messages as PCP and her assistant are out of the office and she should have been advised to go back to the ER when she called earlier.  Patient demanded I schedule her for a visit on Monday with PCP and I declined and advised she go to the ER now.  Patient was informed I will not continue to argue with her and she stated she will schedule an appt through her Mychart and I disconnected the call.  Message sent to PCP.

## 2023-05-19 NOTE — ED Notes (Signed)
 Amend not 05/15/2023 Pt in CT at time of activation.  Pt remained in CT at time of cart disconnect

## 2023-06-09 ENCOUNTER — Other Ambulatory Visit: Payer: Self-pay | Admitting: Obstetrics and Gynecology

## 2023-06-09 DIAGNOSIS — Z1231 Encounter for screening mammogram for malignant neoplasm of breast: Secondary | ICD-10-CM

## 2023-06-10 ENCOUNTER — Other Ambulatory Visit: Payer: Self-pay | Admitting: Internal Medicine

## 2023-06-10 ENCOUNTER — Encounter: Payer: Self-pay | Admitting: Internal Medicine

## 2023-06-10 ENCOUNTER — Other Ambulatory Visit (HOSPITAL_COMMUNITY): Payer: Self-pay

## 2023-06-10 DIAGNOSIS — R110 Nausea: Secondary | ICD-10-CM

## 2023-06-10 DIAGNOSIS — E669 Obesity, unspecified: Secondary | ICD-10-CM

## 2023-06-10 MED ORDER — ONDANSETRON 4 MG PO TBDP: 4.0000 mg | ORAL_TABLET | Freq: Three times a day (TID) | ORAL | 0 refills | Status: DC | PRN

## 2023-06-10 MED ORDER — WEGOVY 0.5 MG/0.5ML ~~LOC~~ SOAJ: 0.5000 mg | SUBCUTANEOUS | 0 refills | Status: DC

## 2023-06-25 ENCOUNTER — Ambulatory Visit
Admission: RE | Admit: 2023-06-25 | Discharge: 2023-06-25 | Disposition: A | Discharge status: Home / Self Care | Source: Ambulatory Visit | Attending: Obstetrics and Gynecology | Admitting: Obstetrics and Gynecology

## 2023-06-25 ENCOUNTER — Encounter: Payer: Self-pay | Admitting: Radiology

## 2023-06-25 DIAGNOSIS — Z1231 Encounter for screening mammogram for malignant neoplasm of breast: Secondary | ICD-10-CM | POA: Diagnosis present

## 2023-07-02 ENCOUNTER — Other Ambulatory Visit: Payer: Self-pay | Admitting: Internal Medicine

## 2023-07-02 ENCOUNTER — Other Ambulatory Visit (HOSPITAL_COMMUNITY): Payer: Self-pay

## 2023-07-02 ENCOUNTER — Encounter: Payer: Self-pay | Admitting: Internal Medicine

## 2023-07-02 DIAGNOSIS — E669 Obesity, unspecified: Secondary | ICD-10-CM

## 2023-07-02 MED ORDER — WEGOVY 1 MG/0.5ML ~~LOC~~ SOAJ
1.0000 mg | SUBCUTANEOUS | 0 refills | Status: DC
  Filled 2023-07-02: qty 2, 28d supply, fill #0

## 2023-07-07 ENCOUNTER — Ambulatory Visit: Admitting: Internal Medicine

## 2023-07-17 ENCOUNTER — Ambulatory Visit: Admitting: Internal Medicine

## 2023-07-17 ENCOUNTER — Encounter: Payer: Self-pay | Admitting: Internal Medicine

## 2023-07-17 VITALS — BP 110/80 | HR 79 | Temp 97.8°F | Wt 233.0 lb

## 2023-07-17 DIAGNOSIS — L509 Urticaria, unspecified: Secondary | ICD-10-CM | POA: Diagnosis not present

## 2023-07-17 DIAGNOSIS — R220 Localized swelling, mass and lump, head: Secondary | ICD-10-CM | POA: Diagnosis not present

## 2023-07-17 NOTE — Progress Notes (Signed)
 Established Patient Office Visit     CC/Reason for Visit: Recurrent facial swelling and hives  HPI: Brittney [name] is a 53 y.o. female who is coming in today for the above mentioned reasons. Past Medical History is significant for: Asthma.  She was seen in May for eyelid swelling.  The next day the swelling progressed to her entire left side of the face causing a facial droop.  She was rushed to the emergency department and treated as a code stroke.  All stroke workup was negative.  It was the opinion of the neurologist that this was likely related to her allergic reaction.  Since then she has had at least 3 more episodes of the same.  Sometimes it is accompanied by hives.  She has been keeping detailed pictures of these episodes.  She has an appointment to see the allergist end of August.  She has not had any difficulty breathing or trouble swallowing throughout these episodes.  Interestingly, she started the Wegovy  shortly before the facial swelling started.   Past Medical/Surgical History: Past Medical History:  Diagnosis Date   Allergy    seasonal   Anemia    Anxiety    Asthma    Insomnia    Obesity (BMI 35.0-39.9 without comorbidity)    Oral herpes     Past Surgical History:  Procedure Laterality Date   APPENDECTOMY     BILATERAL SALPINGOOPHORECTOMY     ovaries left   CHOLECYSTECTOMY     COLONOSCOPY     LAPAROSCOPIC APPENDECTOMY N/A 10/20/2020   Procedure: APPENDECTOMY LAPAROSCOPIC;  Surgeon: Kinsinger, Herlene Righter, MD;  Location: [facility] OR;  Service: General;  Laterality: N/A;   lapband     TONSILLECTOMY     tummy tuck      Social History:  reports that she has never smoked. She has never used smokeless tobacco. She reports that she does not currently use alcohol. She reports that she does not use drugs.  Allergies: Allergies  Allergen Reactions   Other     All nuts   Peanut-Containing Drug Products Hives   Sulfa Antibiotics Nausea And Vomiting    Family  History:  Family History  Problem Relation Age of Onset   Healthy Mother    Cancer Father    Pancreatic cancer Father    Breast cancer Maternal Grandmother    Colon cancer Neg Hx    Colon polyps Neg Hx    Esophageal cancer Neg Hx    Rectal cancer Neg Hx    Stomach cancer Neg Hx      Current Outpatient Medications:    albuterol  (PROVENTIL ) (2.5 MG/3ML) 0.083% nebulizer solution, Take 3 mLs (2.5 mg total) by nebulization every 6 (six) hours as needed for wheezing or shortness of breath., Disp: 75 mL, Rfl: 0   albuterol  (VENTOLIN  HFA) 108 (90 Base) MCG/ACT inhaler, INHALE 1-2 PUFFS INTO THE LUNGS EVERY 6 HOURS AS NEEDED., Disp: 6.7 each, Rfl: 2   ALPRAZolam  (XANAX ) 0.5 MG tablet, Take 1 tablet (0.5 mg total) by mouth daily as needed., Disp: 90 tablet, Rfl: 0   atorvastatin  (LIPITOR) 40 MG tablet, Take 1 tablet (40 mg total) by mouth daily., Disp: 90 tablet, Rfl: 1   cetirizine  (ZYRTEC ) 10 MG tablet, Take 1 tablet (10 mg total) by mouth daily., Disp: 90 tablet, Rfl: 1   cyclobenzaprine  (FLEXERIL ) 10 MG tablet, Take 1 tablet (10 mg total) by mouth 2 (two) times daily as needed for muscle spasms., Disp: 20  tablet, Rfl: 0   EPINEPHrine  0.3 mg/0.3 mL IJ SOAJ injection, Inject 0.3 mg into the muscle as needed for anaphylaxis., Disp: 1 each, Rfl: 0   EPINEPHrine  0.3 mg/0.3 mL IJ SOAJ injection, Inject 0.3 mg into the muscle as needed for anaphylaxis., Disp: 1 each, Rfl: 0   famotidine  (PEPCID ) 20 MG tablet, Take 1 tablet (20 mg total) by mouth 2 (two) times daily., Disp: 30 tablet, Rfl: 0   fluticasone  (FLONASE ) 50 MCG/ACT nasal spray, Place 2 sprays into both nostrils daily. (Patient taking differently: Place 2 sprays into both nostrils as needed.), Disp: 16 g, Rfl: 0   Multiple Vitamin (MULTIVITAMIN) capsule, Take 1 capsule by mouth daily., Disp: , Rfl:    ondansetron  (ZOFRAN -ODT) 4 MG disintegrating tablet, Take 1 tablet (4 mg total) by mouth every 8 (eight) hours as needed for nausea or  vomiting., Disp: 20 tablet, Rfl: 0   predniSONE  (DELTASONE ) 20 MG tablet, 2 tabs po daily x 4 days, Disp: 8 tablet, Rfl: 0   Semaglutide -Weight Management (WEGOVY ) 1 MG/0.5ML SOAJ, Inject 1 mg into the skin once a week., Disp: 2 mL, Rfl: 0   tacrolimus  (PROTOPIC ) 0.1 % ointment, Apply topically 2 (two) times daily., Disp: 100 g, Rfl: 1   valACYclovir  (VALTREX ) 1000 MG tablet, Take 1 tablet (1,000 mg total) by mouth daily as needed., Disp: 90 tablet, Rfl: 1   VITAMIN D  PO, Take by mouth daily., Disp: , Rfl:   Review of Systems:  Negative unless indicated in HPI.   Physical Exam: Vitals:   07/17/23 1551  BP: 110/80  Pulse: 79  Temp: 97.8 F (36.6 C)  TempSrc: Oral  SpO2: 99%  Weight: 233 lb (105.7 kg)    Body mass index is 36.49 kg/m.    Impression and Plan:  Facial swelling  Hives  -Concerned about allergic reaction due to recurrent nature and hives.  No pain during these episodes.  Since timing of initiating Wegovy  fits, have advised that she discontinue for now.  Will see if we can get her to see an allergist sooner.   Time spent:22 minutes reviewing chart, interviewing and examining patient and formulating plan of care.     Tully Theophilus Andrews, MD [facility]

## 2023-07-18 ENCOUNTER — Encounter: Payer: Self-pay | Admitting: Internal Medicine

## 2023-07-18 ENCOUNTER — Other Ambulatory Visit: Payer: Self-pay

## 2023-07-18 ENCOUNTER — Ambulatory Visit (INDEPENDENT_AMBULATORY_CARE_PROVIDER_SITE_OTHER): Admitting: Internal Medicine

## 2023-07-18 ENCOUNTER — Other Ambulatory Visit: Payer: Self-pay | Admitting: Internal Medicine

## 2023-07-18 VITALS — BP 118/74 | HR 82 | Temp 98.2°F | Resp 18 | Ht 67.0 in | Wt 232.5 lb

## 2023-07-18 DIAGNOSIS — T7800XA Anaphylactic reaction due to unspecified food, initial encounter: Secondary | ICD-10-CM | POA: Insufficient documentation

## 2023-07-18 DIAGNOSIS — L508 Other urticaria: Secondary | ICD-10-CM | POA: Diagnosis not present

## 2023-07-18 DIAGNOSIS — J310 Chronic rhinitis: Secondary | ICD-10-CM

## 2023-07-18 DIAGNOSIS — J4520 Mild intermittent asthma, uncomplicated: Secondary | ICD-10-CM | POA: Diagnosis not present

## 2023-07-18 DIAGNOSIS — Z882 Allergy status to sulfonamides status: Secondary | ICD-10-CM

## 2023-07-18 DIAGNOSIS — T7800XD Anaphylactic reaction due to unspecified food, subsequent encounter: Secondary | ICD-10-CM | POA: Diagnosis not present

## 2023-07-18 DIAGNOSIS — T370X5A Adverse effect of sulfonamides, initial encounter: Secondary | ICD-10-CM

## 2023-07-18 MED ORDER — AIRSUPRA 90-80 MCG/ACT IN AERO: 2.0000 | INHALATION_SPRAY | RESPIRATORY_TRACT | 3 refills | Status: DC | PRN

## 2023-07-18 MED ORDER — FAMOTIDINE 20 MG PO TABS: 20.0000 mg | ORAL_TABLET | Freq: Two times a day (BID) | ORAL | 5 refills | Status: DC

## 2023-07-18 MED ORDER — CETIRIZINE HCL 10 MG PO TABS: ORAL_TABLET | ORAL | 3 refills | Status: DC

## 2023-07-18 MED ORDER — EPINEPHRINE 0.3 MG/0.3ML IJ SOAJ: 0.3000 mg | INTRAMUSCULAR | 0 refills | Status: AC | PRN

## 2023-07-18 MED ORDER — TRIAMCINOLONE ACETONIDE 55 MCG/ACT NA AERO: 2.0000 | INHALATION_SPRAY | Freq: Every day | NASAL | 12 refills | Status: AC

## 2023-07-18 NOTE — Progress Notes (Signed)
 NEW PATIENT Date of Service/Encounter:   07/18/2023 Referring provider: Theophilus Andrews, Jonna* Primary care provider: Theophilus Andrews, Tully GRADE, MD  Subjective:  Brittney [name] is a 53 y.o. female with a PMHx of obesity, insomnia, hyperlipidemia presenting today for evaluation of urticaria and angioedema, asthma, atopic dermatitis.  History obtained from: chart review and patient.   Discussed the use of AI scribe software for clinical note transcription with the patient, who gave verbal consent to proceed.  History of Present Illness   The patient, with asthma and peanut and tree nut allergies, presents with recurrent hives and facial swelling. She was referred by her primary care provider to see an allergist.  Urticaria and angioedema - Recurrent hives and facial swelling since returning from [place] during Easter week - Initial episode involved facial swelling and hives, thought to be related to known peanut allergy and possible though unverified exposure - Symptoms resolved after a 1-week course of prednisone  - Two weeks later, after starting Wegovy , developed left-sided facial swelling requiring emergency room visit; treated with Zyrtec  and Pepcid  AC twice daily for two weeks; - Swelling and hives lasted approximately three days during this episode - Recent episode started last week in [facility] with bumps, itching, and subsequent facial and eye swelling - Hives are migratory, with new lesions behind knees and under arms - Itching worsens after hot showers or with exposure to heat and pressure - No obvious triggers identified; careful with diet due to known peanut and tree nut allergies - Benadryl  provides more effective symptom relief than Zyrtec ; currently using Benadryl  and Pepcid  AC  Asthma - Seasonal asthma managed with albuterol  as needed - No mention of recent asthma exacerbations  Atopic and allergic history - History of peanut and tree nut allergies; cautious  with dietary intake - History of eczema as a child, not present in adulthood - Known allergy to sulfa medications, causing nausea and vomiting - Seasonal allergies, particularly in the fall - Previous use of Claritin , Allegra, and Nasonex for allergic symptoms - Recent use of Pataday for eye swelling and topical medication for eyelids including tacrolimus , both providing some relief      Chart Review:  Reviewed PCP notes from referral 05/14/23: Seen for asthma and recurrent hives and eyelid swelling.  Taking Zyrtec , have been given prednisone  taper from urgent care.  Hives resolved then returned.  Zyrtec  increased to twice daily and patient referred to our clinic.   Past Medical History: Past Medical History:  Diagnosis Date   Allergy    seasonal   Anemia    Anxiety    Asthma    Insomnia    Obesity (BMI 35.0-39.9 without comorbidity)    Oral herpes    Medication List:  Current Outpatient Medications  Medication Sig Dispense Refill   albuterol  (PROVENTIL ) (2.5 MG/3ML) 0.083% nebulizer solution Take 3 mLs (2.5 mg total) by nebulization every 6 (six) hours as needed for wheezing or shortness of breath. 75 mL 0   albuterol  (VENTOLIN  HFA) 108 (90 Base) MCG/ACT inhaler INHALE 1-2 PUFFS INTO THE LUNGS EVERY 6 HOURS AS NEEDED. 6.7 each 2   ALPRAZolam  (XANAX ) 0.5 MG tablet Take 1 tablet (0.5 mg total) by mouth daily as needed. 90 tablet 0   atorvastatin  (LIPITOR) 40 MG tablet Take 1 tablet (40 mg total) by mouth daily. 90 tablet 1   cetirizine  (ZYRTEC ) 10 MG tablet Take 1 tablet (10 mg total) by mouth daily. 90 tablet 1   cyclobenzaprine  (FLEXERIL ) 10 MG  tablet Take 1 tablet (10 mg total) by mouth 2 (two) times daily as needed for muscle spasms. 20 tablet 0   EPINEPHrine  0.3 mg/0.3 mL IJ SOAJ injection Inject 0.3 mg into the muscle as needed for anaphylaxis. 1 each 0   EPINEPHrine  0.3 mg/0.3 mL IJ SOAJ injection Inject 0.3 mg into the muscle as needed for anaphylaxis. 1 each 0   famotidine   (PEPCID ) 20 MG tablet Take 1 tablet (20 mg total) by mouth 2 (two) times daily. 30 tablet 0   fluticasone  (FLONASE ) 50 MCG/ACT nasal spray Place 2 sprays into both nostrils daily. (Patient taking differently: Place 2 sprays into both nostrils as needed.) 16 g 0   Multiple Vitamin (MULTIVITAMIN) capsule Take 1 capsule by mouth daily.     ondansetron  (ZOFRAN -ODT) 4 MG disintegrating tablet Take 1 tablet (4 mg total) by mouth every 8 (eight) hours as needed for nausea or vomiting. 20 tablet 0   predniSONE  (DELTASONE ) 20 MG tablet 2 tabs po daily x 4 days 8 tablet 0   Semaglutide -Weight Management (WEGOVY ) 1 MG/0.5ML SOAJ Inject 1 mg into the skin once a week. 2 mL 0   tacrolimus  (PROTOPIC ) 0.1 % ointment Apply topically 2 (two) times daily. 100 g 1   valACYclovir  (VALTREX ) 1000 MG tablet Take 1 tablet (1,000 mg total) by mouth daily as needed. 90 tablet 1   VITAMIN D  PO Take by mouth daily.     No current facility-administered medications for this visit.   Known Allergies:  Allergies  Allergen Reactions   Other     All nuts   Peanut-Containing Drug Products Hives   Sulfa Antibiotics Nausea And Vomiting   Past Surgical History: Past Surgical History:  Procedure Laterality Date   ADENOIDECTOMY     APPENDECTOMY     BILATERAL SALPINGOOPHORECTOMY     ovaries left   CHOLECYSTECTOMY     COLONOSCOPY     LAPAROSCOPIC APPENDECTOMY N/A 10/20/2020   Procedure: APPENDECTOMY LAPAROSCOPIC;  Surgeon: Kinsinger, Herlene Righter, MD;  Location: [facility] OR;  Service: General;  Laterality: N/A;   lapband     TONSILLECTOMY     tummy tuck     Family History: Family History  Problem Relation Age of Onset   Allergic rhinitis Mother    Cancer Father    Pancreatic cancer Father    Breast cancer Maternal Grandmother    Colon cancer Neg Hx    Colon polyps Neg Hx    Esophageal cancer Neg Hx    Rectal cancer Neg Hx    Stomach cancer Neg Hx    Social History: [place] lives in a house built 30 years ago, no  water damage, hardwood floors, electric heating, central AC, 3 dogs, indoor cows and cat, no roaches, 2 feet off the floor, no smoke exposure, senior management,  +HEPA filters, [place].  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  ROS:  All other systems negative except as noted per HPI.  Objective:  Blood pressure 118/74, pulse 82, temperature 98.2 F (36.8 C), temperature source Temporal, resp. rate 18, height 5' 7 (1.702 m), weight 232 lb 8 oz (105.5 kg), last menstrual period 08/07/2021, SpO2 99%. Body mass index is 36.41 kg/m. Physical Exam:  General Appearance:  Alert, cooperative, no distress, appears stated age  Head:  Normocephalic, without obvious abnormality, atraumatic  Eyes:  Conjunctiva clear, EOM's intact  Ears EACs normal bilaterally and normal TMs bilaterally  Nose: Nares normal, hypertrophic turbinates, normal mucosa, and  no visible anterior polyps  Throat: Lips, tongue normal; teeth and gums normal, normal posterior oropharynx  Neck: Supple, symmetrical  Lungs:   clear to auscultation bilaterally, Respirations unlabored, no coughing  Heart:  regular rate and rhythm and no murmur, Appears well perfused  Extremities: No edema  Skin: Erythematous edematous circular raised plaques on right upper extremity, fading on left upper extremity, few scattered similar rashes on left lower extremity  Neurologic: No gross deficits   Diagnostics: Spirometry:  Tracings reviewed. Her effort: It was hard to get consistent efforts and there is a question as to whether this reflects a maximal maneuver. FVC: 5.19L (pre), 3.54L  (post) FEV1: 2.89L, 94% predicted (pre), 2.93L, 95% predicted (post) FEV1/FVC ratio: 0.56 (pre), 0.83 (post) Interpretation: Spirometry consistent with mild obstructive disease.  No significant improvement following postbronchodilator in FEV1/FVC.  However no longer seeing obstruction.  Suspect technique related Please see  scanned spirometry results for details.  Labs:  Lab Orders  No laboratory test(s) ordered today     Assessment and Plan   Assessment and Plan    Chronic idiopathic urticaria Recurrent hives and facial swelling without clear triggers, indicating chronic idiopathic urticaria. Symptoms likely non-allergicCurrent management partially effective. - this is defined as hives lasting more than 6 weeks without an identifiable trigger - hives can be from a number of different sources including infections, allergies, vibration, temperature, pressure among many others other possible causes - often an identifiable cause is not determined - some potential triggers include: stress, illness, NSAIDs, aspirin, hormonal changes - you do not have any red flag symptoms to make us  concerned about secondary causes of hives, but we will screen for these for reassurance - approximately 50% of patients with chronic hives can have some associated swelling of the face/lips/eyelids (this is not a cause for alarm and does not typically progress onto systemic allergic reactions)  Therapy Plan:  -  increase dose of zyrtec  (cetirizine ) to max dose of 20mg  (2 pills) twice daily- this is maximum dose - can increase or decrease dosing depending on symptom control to a maximum dose of 4 tablets of antihistamine daily. Wait until hives free for at least one month prior to decreasing dose.   - if hives are still uncontrolled with the above regimen, please arrange an appointment for discussion of Xolair (omalizumab)- an injectable medication for hives  Can use one of the following in place of zyrtec  if desires: Claritin  (loratadine ) 10 mg, Xyzal (levocetirizine) 5 mg or Allegra (fexofenadine) 180 mg daily as needed  Peanut and tree nut allergy Confirmed allergy with lip swelling and tongue tingling upon exposure. Managed with Benadryl  and allergen avoidance. - Continue to avoid peanuts and tree nuts. - Ensure EpiPen  is  available for emergency use. - Order blood work to confirm allergies.  Sulfa intolerance Avoidance recommended  Asthma Seasonal asthma exacerbated by outdoor allergens. Current management with albuterol ; discussed need for Airsupra  for better control. - Switch to Airsupra  for asthma management. - Order baseline breathing test.  Chronic rhinitis;  Worse in fall.  - continue zyrtec  as above -can use nasacort  1 spray in each nostril twice daily as needed -labs to determine exact environmental allergens.  Follow up : 4-6 weeks, sooner if needed It was a pleasure meeting you in clinic today! Thank you for allowing me to participate in your care.  Rocky Endow, MD [facility]         This note in its entirety was forwarded  to the Provider who requested this consultation.  Other: none  Thank you for your kind referral. I appreciate the opportunity to take part in Gethsemane's care. Please do not hesitate to contact me with questions.  Sincerely,  Rocky Endow, MD [facility] 

## 2023-07-18 NOTE — Patient Instructions (Addendum)
 Chronic idiopathic urticaria Recurrent hives and facial swelling without clear triggers, indicating chronic idiopathic urticaria. Symptoms likely non-allergicCurrent management partially effective. - this is defined as hives lasting more than 6 weeks without an identifiable trigger - hives can be from a number of different sources including infections, allergies, vibration, temperature, pressure among many others other possible causes - often an identifiable cause is not determined - some potential triggers include: stress, illness, NSAIDs, aspirin, hormonal changes - you do not have any red flag symptoms to make us  concerned about secondary causes of hives, but we will screen for these for reassurance - approximately 50% of patients with chronic hives can have some associated swelling of the face/lips/eyelids (this is not a cause for alarm and does not typically progress onto systemic allergic reactions)  Therapy Plan:  -  increase dose of zyrtec  (cetirizine ) to max dose of 20mg  (2 pills) twice daily- this is maximum dose - can increase or decrease dosing depending on symptom control to a maximum dose of 4 tablets of antihistamine daily. Wait until hives free for at least one month prior to decreasing dose.   - if hives are still uncontrolled with the above regimen, please arrange an appointment for discussion of Xolair (omalizumab)- an injectable medication for hives  Can use one of the following in place of zyrtec  if desires: Claritin  (loratadine ) 10 mg, Xyzal (levocetirizine) 5 mg or Allegra (fexofenadine) 180 mg daily as needed     Peanut and tree nut allergy Confirmed allergy with lip swelling and tongue tingling upon exposure. Managed with Benadryl  and allergen avoidance. - Continue to avoid peanuts and tree nuts. - Ensure EpiPen  is available for emergency use. - Order blood work to confirm allergies.  Sulfa intolerance Avoidance recommended  Asthma Seasonal asthma exacerbated by  outdoor allergens. Current management with albuterol ; discussed need for Airsupra  for better control. Breathing test showed mild obstruction.  - Switch to Airsupra  for asthma management. - Order baseline breathing test.  Chronic rhinitis;  Worse in fall.  - continue zyrtec  as above -can use nasacort  1 spray in each nostril twice daily as needed -labs to determine exact environmental allergens.  Follow up : 4-6 weeks, sooner if needed It was a pleasure meeting you in clinic today! Thank you for allowing me to participate in your care.  Rocky Endow, MD [facility]

## 2023-07-22 ENCOUNTER — Encounter: Payer: Self-pay | Admitting: Internal Medicine

## 2023-07-22 DIAGNOSIS — E669 Obesity, unspecified: Secondary | ICD-10-CM

## 2023-07-22 LAB — ALPHA-GAL PANEL
Allergen Lamb IgE: <0.1 kU/L
Beef IgE: <0.1 kU/L
IgE (Immunoglobulin E), Serum: 91 [IU]/mL (ref 6–495)
O215-IgE Alpha-Gal: <0.1 kU/L
Pork IgE: <0.1 kU/L

## 2023-07-23 MED ORDER — WEGOVY 1 MG/0.5ML ~~LOC~~ SOAJ: 1.0000 mg | SUBCUTANEOUS | 0 refills | Status: DC

## 2023-08-01 ENCOUNTER — Ambulatory Visit: Payer: Self-pay | Admitting: Internal Medicine

## 2023-08-01 LAB — CBC WITH DIFFERENTIAL/PLATELET
Basophils Absolute: 0 x10E3/uL (ref 0.0–0.2)
Basos: 0 %
EOS (ABSOLUTE): 0.1 x10E3/uL (ref 0.0–0.4)
Eos: 2 %
Hematocrit: 40 % (ref 34.0–46.6)
Hemoglobin: 13.2 g/dL (ref 11.1–15.9)
Immature Grans (Abs): 0 x10E3/uL (ref 0.0–0.1)
Immature Granulocytes: 0 %
Lymphocytes Absolute: 2 x10E3/uL (ref 0.7–3.1)
Lymphs: 30 %
MCH: 30 pg (ref 26.6–33.0)
MCHC: 33 g/dL (ref 31.5–35.7)
MCV: 91 fL (ref 79–97)
Monocytes Absolute: 0.4 x10E3/uL (ref 0.1–0.9)
Monocytes: 7 %
Neutrophils Absolute: 4 x10E3/uL (ref 1.4–7.0)
Neutrophils: 61 %
Platelets: 266 x10E3/uL (ref 150–450)
RBC: 4.4 x10E6/uL (ref 3.77–5.28)
RDW: 12.7 % (ref 11.7–15.4)
WBC: 6.5 x10E3/uL (ref 3.4–10.8)

## 2023-08-01 LAB — ALLERGENS, ZONE 2
Alternaria Alternata IgE: 3.2 kU/L — AB
Amer Sycamore IgE Qn: <0.1 kU/L
Aspergillus Fumigatus IgE: <0.1 kU/L
Bahia Grass IgE: 1.39 kU/L — AB
Bermuda Grass IgE: 0.83 kU/L — AB
Cat Dander IgE: 1.78 kU/L — AB
Cedar, Mountain IgE: 0.36 kU/L — AB
Cladosporium Herbarum IgE: <0.1 kU/L
Cockroach, American IgE: <0.1 kU/L
Common Silver Birch IgE: <0.1 kU/L
D Farinae IgE: 1.43 kU/L — AB
D Pteronyssinus IgE: 1.54 kU/L — AB
Dog Dander IgE: 0.45 kU/L — AB
Elm, American IgE: <0.1 kU/L
Hickory, White IgE: 0.15 kU/L — AB
Johnson Grass IgE: 1.07 kU/L — AB
Maple/Box Elder IgE: <0.1 kU/L
Mucor Racemosus IgE: <0.1 kU/L
Mugwort IgE Qn: <0.1 kU/L
Nettle IgE: <0.1 kU/L
Oak, White IgE: <0.1 kU/L
Penicillium Chrysogen IgE: <0.1 kU/L
Pigweed, Rough IgE: <0.1 kU/L
Plantain, English IgE: <0.1 kU/L
Ragweed, Short IgE: 0.4 kU/L — AB
Sheep Sorrel IgE Qn: <0.1 kU/L
Stemphylium Herbarum IgE: 0.49 kU/L — AB
Sweet gum IgE RAST Ql: <0.1 kU/L
Timothy Grass IgE: 4.86 kU/L — AB
White Mulberry IgE: <0.1 kU/L

## 2023-08-01 LAB — CMP14+EGFR
ALT: 11 IU/L (ref 0–32)
AST: 18 IU/L (ref 0–40)
Albumin: 4.7 g/dL (ref 3.8–4.9)
Alkaline Phosphatase: 94 IU/L (ref 44–121)
BUN/Creatinine Ratio: 19 (ref 9–23)
BUN: 15 mg/dL (ref 6–24)
Bilirubin Total: 0.6 mg/dL (ref 0.0–1.2)
CO2: 22 mmol/L (ref 20–29)
Calcium: 9.8 mg/dL (ref 8.7–10.2)
Chloride: 101 mmol/L (ref 96–106)
Creatinine, Ser: 0.77 mg/dL (ref 0.57–1.00)
Globulin, Total: 2.4 g/dL (ref 1.5–4.5)
Glucose: 90 mg/dL (ref 70–99)
Potassium: 4.1 mmol/L (ref 3.5–5.2)
Sodium: 141 mmol/L (ref 134–144)
Total Protein: 7.1 g/dL (ref 6.0–8.5)
eGFR: 92 mL/min/1.73 (ref >59)

## 2023-08-01 LAB — THYROID PANEL WITH TSH
Free Thyroxine Index: 2.3 (ref 1.2–4.9)
T3 Uptake Ratio: 27 % (ref 24–39)
T4, Total: 8.5 ug/dL (ref 4.5–12.0)
TSH: 0.883 u[IU]/mL (ref 0.450–4.500)

## 2023-08-01 LAB — IGE NUT PROF. W/COMPONENT RFLX
F017-IgE Hazelnut (Filbert): <0.1 kU/L
F018-IgE Brazil Nut: <0.1 kU/L
F020-IgE Almond: <0.1 kU/L
F202-IgE Cashew Nut: <0.1 kU/L
F203-IgE Pistachio Nut: <0.1 kU/L
F256-IgE Walnut: <0.1 kU/L
Macadamia Nut, IgE: <0.1 kU/L
Peanut, IgE: <0.1 kU/L
Pecan Nut IgE: <0.1 kU/L

## 2023-08-01 LAB — THYROID ANTIBODIES (THYROPEROXIDASE & THYROGLOBULIN)
Thyroglobulin Antibody: <1 [IU]/mL (ref 0.0–0.9)
Thyroperoxidase Ab SerPl-aCnc: 13 [IU]/mL (ref 0–34)

## 2023-08-01 LAB — CHRONIC URTICARIA PD-BAT: Pooled Donor- BAT CU: 18.58 % — ABNORMAL HIGH (ref 0.00–10.60)

## 2023-08-01 LAB — TRYPTASE: Tryptase: 8.1 ug/L (ref 2.2–13.2)

## 2023-08-01 LAB — IGE: IgE (Immunoglobulin E), Serum: 112 [IU]/mL (ref 6–495)

## 2023-08-17 ENCOUNTER — Encounter: Payer: Self-pay | Admitting: Internal Medicine

## 2023-08-20 ENCOUNTER — Other Ambulatory Visit: Payer: Self-pay | Admitting: Internal Medicine

## 2023-08-20 DIAGNOSIS — E669 Obesity, unspecified: Secondary | ICD-10-CM

## 2023-08-20 MED ORDER — WEGOVY 1.7 MG/0.75ML ~~LOC~~ SOAJ: 1.7000 mg | SUBCUTANEOUS | 0 refills | Status: DC

## 2023-09-03 ENCOUNTER — Encounter: Payer: Self-pay | Admitting: Internal Medicine

## 2023-09-03 ENCOUNTER — Ambulatory Visit (INDEPENDENT_AMBULATORY_CARE_PROVIDER_SITE_OTHER): Admitting: Internal Medicine

## 2023-09-03 VITALS — BP 94/60 | HR 68 | Temp 98.3°F | Resp 16

## 2023-09-03 DIAGNOSIS — Z888 Allergy status to other drugs, medicaments and biological substances status: Secondary | ICD-10-CM

## 2023-09-03 DIAGNOSIS — T7800XD Anaphylactic reaction due to unspecified food, subsequent encounter: Secondary | ICD-10-CM | POA: Diagnosis not present

## 2023-09-03 DIAGNOSIS — J3089 Other allergic rhinitis: Secondary | ICD-10-CM | POA: Diagnosis not present

## 2023-09-03 DIAGNOSIS — J4520 Mild intermittent asthma, uncomplicated: Secondary | ICD-10-CM

## 2023-09-03 DIAGNOSIS — T7800XA Anaphylactic reaction due to unspecified food, initial encounter: Secondary | ICD-10-CM

## 2023-09-03 DIAGNOSIS — T370X5D Adverse effect of sulfonamides, subsequent encounter: Secondary | ICD-10-CM

## 2023-09-03 DIAGNOSIS — J302 Other seasonal allergic rhinitis: Secondary | ICD-10-CM

## 2023-09-03 DIAGNOSIS — L508 Other urticaria: Secondary | ICD-10-CM | POA: Diagnosis not present

## 2023-09-03 NOTE — Progress Notes (Signed)
 FOLLOW UP Date of Service/Encounter:   09/03/2023  Subjective:  Brittney [name] (DOB: 06/25/1970) is a 53 y.o. female who [facility] in re-evaluation of the following: chronic urticaria History obtained from: chart review and patient.  For Review, LV was on 07/18/23  with Dr.Dell Briner seen for intial visit for chronic urtifcaria. See below for summary of history and diagnostics.   ----------------------------------------------------- Pertinent History/Diagnostics:  Urticaria:  Recurrent hives and facial swelling without clear triggers, indicating chronic idiopathic urticaria. Symptoms likely non-allergicCurrent management partially effective.  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 Managed with Benadryl  and allergen avoidance. -Blood work negative to nut panel, skin testing recommended followed by oral challenge if indicated Other: Sulfa intolerance --------------------------------------------------- Today presents for follow-up. Discussed the use of AI scribe software for clinical note transcription with the patient, who gave verbal consent to proceed.  History of Present Illness Brittney [name] is a 53 year old female with chronic hives who presents for management of her condition.  Chronic urticaria - Chronic hives managed with Zyrtec , initially two doses in the morning and two at night, reduced to two doses at noon due to nightmares as a side effect - If a dose is missed, hives develop, particularly on the legs, and resolve after resuming  medication - Current regimen includes two doses of Zyrtec  at noon, Tegamet, and Pepcid , which provides effective control of hives - Frequent travel disrupts medication routine, leading to missed doses and recurrence of hives - Emphasizes the necessity of strict adherence to medication schedule to prevent outbreaks - Marker present for autoimmune form of urticaria  Medication side effects - Nightmares occurred with higher doses of Zyrtec , prompting reduction in dosing frequency  Psychosocial stressors - Recent travel to [place]  to care for her hospitalized mother with systemic colitis increased stress and contributed to challenges in maintaining her medication schedule    All medications reviewed by clinical staff and updated in chart. No new pertinent medical or surgical history except as noted in HPI.  ROS: All others negative except as noted per HPI.   Objective:  BP 94/60   Pulse 68   Temp 98.3 F (36.8 C) (Temporal)   Resp 16   LMP 08/07/2021 (Approximate)   SpO2 100%  There is no height or weight on file to calculate BMI. Physical Exam: General Appearance:  Alert, cooperative, no distress, appears stated age  Head:  Normocephalic, without obvious abnormality, atraumatic  Eyes:  Conjunctiva clear, EOM's intact  Ears EACs normal bilaterally and normal TMs bilaterally  Nose: Nares normal, hypertrophic turbinates and normal mucosa  Throat: Lips, tongue normal; teeth and gums normal, normal posterior oropharynx  Neck: Supple, symmetrical  Lungs:   clear to auscultation bilaterally, Respirations unlabored, no coughing  Heart:  regular rate and rhythm and no murmur, Appears well perfused  Extremities: No edema  Skin: Skin color, texture, turgor normal and no rashes or lesions on visualized portions of skin  Neurologic: No gross deficits   Labs:  Lab Orders  No laboratory test(s) ordered today     Assessment/Plan   Chronic idiopathic urticaria-well-controlled Previous  lab testing was overall reassuring but that was present indicating autoimmune urticaria.  Therapy Plan:  - Continue zyrtec  (cetirizine ) 10 mg to max dose of 20mg  (2 pills) twice daily as needed- this is maximum dose - can increase or decrease dosing depending on symptom control to a maximum dose of 4 tablets of antihistamine daily. Wait until hives free for at least one month prior to decreasing dose.   - if hives are still uncontrolled with the above regimen, please arrange an appointment for discussion of Xolair (omalizumab)- an injectable medication for hives  Can use one of the following in place of zyrtec  if desires: Claritin  (loratadine ) 10 mg, Xyzal (levocetirizine) 5 mg or Allegra (fexofenadine) 180 mg daily as needed  Peanut and tree nut allergy stable History of lip swelling and tongue tingling upon exposure. Managed with Benadryl  and allergen avoidance. - Continue to avoid peanuts and tree nuts. - Ensure EpiPen  is available for emergency use. - Blood testing was negative, consider skin testing followed by oral challenge if indicated  Sulfa intolerance-stable Avoidance recommended  Asthma-well-controlled Seasonal asthma exacerbated by outdoor allergens. Current management with albuterol ; discussed need for Airsupra  for better control. Breathing test showed mild obstruction.  - Switch to Airsupra  for asthma management.  Seasonal and perennial allergic rhinitis well-controlled Worse in fall.  - continue zyrtec  as above -can use nasacort  1 spray in each nostril twice daily as needed - Previous lab testing positive to grass pollen, weed pollen, tree pollen, dust mite, cat, dog and molds, allergen avoidance -Consider allergy injections to reduce lifetime symptoms and need for medications by teaching your immune system to become tolerant of the environmental allergens you are allergic to   Follow up : 6 to 12 months, sooner if needed It was a pleasure seeing you again in clinic  today! Thank you for allowing me to participate in your care.  Rocky Endow, MD [facility]  Other: none  Rocky Endow, MD  [facility] 

## 2023-09-03 NOTE — Patient Instructions (Addendum)
 Chronic idiopathic urticaria Previous lab testing was overall reassuring but that was present indicating autoimmune urticaria.  Therapy Plan:  - Continue zyrtec  (cetirizine ) 10 mg to max dose of 20mg  (2 pills) twice daily as needed- this is maximum dose - can increase or decrease dosing depending on symptom control to a maximum dose of 4 tablets of antihistamine daily. Wait until hives free for at least one month prior to decreasing dose.   - if hives are still uncontrolled with the above regimen, please arrange an appointment for discussion of Xolair (omalizumab)- an injectable medication for hives  Can use one of the following in place of zyrtec  if desires: Claritin  (loratadine ) 10 mg, Xyzal (levocetirizine) 5 mg or Allegra (fexofenadine) 180 mg daily as needed  Peanut and tree nut allergy History of lip swelling and tongue tingling upon exposure. Managed with Benadryl  and allergen avoidance. - Continue to avoid peanuts and tree nuts. - Ensure EpiPen  is available for emergency use. - Blood testing was negative, consider skin testing followed by oral challenge if indicated  Sulfa intolerance Avoidance recommended  Asthma Seasonal asthma exacerbated by outdoor allergens. Current management with albuterol ; discussed need for Airsupra  for better control. Breathing test showed mild obstruction.  - Switch to Airsupra  for asthma management.  Seasonal and perennial allergic rhinitis Worse in fall.  - continue zyrtec  as above -can use nasacort  1 spray in each nostril twice daily as needed - Previous lab testing positive to grass pollen, weed pollen, tree pollen, dust mite, cat, dog and molds, allergen avoidance -Consider allergy injections to reduce lifetime symptoms and need for medications by teaching your immune system to become tolerant of the environmental allergens you are allergic to   Follow up : 6 to 12 months, sooner if needed It was a pleasure seeing you again in clinic  today! Thank you for allowing me to participate in your care.  Rocky Endow, MD [facility] 

## 2023-09-13 ENCOUNTER — Encounter: Payer: Self-pay | Admitting: Internal Medicine

## 2023-09-16 MED ORDER — SEMAGLUTIDE-WEIGHT MANAGEMENT 2.4 MG/0.75ML ~~LOC~~ SOAJ: 2.4000 mg | SUBCUTANEOUS | 0 refills | Status: DC

## 2023-09-16 NOTE — Addendum Note (Signed)
 Addended by: KATHRYNE MILLMAN B on: 09/16/2023 10:22 AM   Modules accepted: Orders

## 2023-09-25 ENCOUNTER — Encounter: Payer: Self-pay | Admitting: Internal Medicine

## 2023-09-25 ENCOUNTER — Ambulatory Visit: Admitting: Internal Medicine

## 2023-09-25 VITALS — BP 124/84 | HR 80 | Temp 98.1°F | Wt 224.8 lb

## 2023-09-25 DIAGNOSIS — F40243 Fear of flying: Secondary | ICD-10-CM | POA: Diagnosis not present

## 2023-09-25 DIAGNOSIS — R110 Nausea: Secondary | ICD-10-CM

## 2023-09-25 DIAGNOSIS — Z23 Encounter for immunization: Secondary | ICD-10-CM | POA: Diagnosis not present

## 2023-09-25 MED ORDER — ALPRAZOLAM 0.25 MG PO TABS: 0.2500 mg | ORAL_TABLET | Freq: Two times a day (BID) | ORAL | 0 refills | Status: AC | PRN

## 2023-09-25 MED ORDER — ONDANSETRON 4 MG PO TBDP: 4.0000 mg | ORAL_TABLET | Freq: Three times a day (TID) | ORAL | 0 refills | Status: AC | PRN

## 2023-09-25 NOTE — Addendum Note (Signed)
 Addended by: KATHRYNE MILLMAN B on: 09/25/2023 04:00 PM   Modules accepted: Orders

## 2023-09-25 NOTE — Progress Notes (Signed)
 Established Patient Office Visit     CC/Reason for Visit: Anxiety with flying  HPI: Brittney [name] is a 53 y.o. female who is coming in today for the above mentioned reasons.  She will be traveling to [place] for work.  Is requesting something for the plane ride.  Is also requesting some Zofran  just in case.   Past Medical/Surgical History: Past Medical History:  Diagnosis Date   Allergy    seasonal   Anemia    Anxiety    Asthma    Chronic urticaria 07/18/2023   Insomnia    Obesity (BMI 35.0-39.9 without comorbidity)    Oral herpes     Past Surgical History:  Procedure Laterality Date   ADENOIDECTOMY     APPENDECTOMY     BILATERAL SALPINGOOPHORECTOMY     ovaries left   CHOLECYSTECTOMY     COLONOSCOPY     LAPAROSCOPIC APPENDECTOMY N/A 10/20/2020   Procedure: APPENDECTOMY LAPAROSCOPIC;  Surgeon: Kinsinger, Herlene Righter, MD;  Location: [facility] OR;  Service: General;  Laterality: N/A;   lapband     TONSILLECTOMY     tummy tuck      Social History:  reports that she has never smoked. She has never used smokeless tobacco. She reports that she does not currently use alcohol. She reports that she does not use drugs.  Allergies: Allergies  Allergen Reactions   Other     All nuts   Peanut-Containing Drug Products Hives   Sulfa Antibiotics Nausea And Vomiting    Family History:  Family History  Problem Relation Age of Onset   Allergic rhinitis Mother    Cancer Father    Pancreatic cancer Father    Breast cancer Maternal Grandmother    Colon cancer Neg Hx    Colon polyps Neg Hx    Esophageal cancer Neg Hx    Rectal cancer Neg Hx    Stomach cancer Neg Hx      Current Outpatient Medications:    albuterol  (PROVENTIL ) (2.5 MG/3ML) 0.083% nebulizer solution, Take 3 mLs (2.5 mg total) by nebulization every 6 (six) hours as needed for wheezing or shortness of breath., Disp: 75 mL, Rfl: 0   Albuterol -Budesonide (AIRSUPRA ) 90-80 MCG/ACT AERO, Inhale 2 puffs into the  lungs as needed (maximum 12 puffs/day)., Disp: 10.7 g, Rfl: 3   ALPRAZolam  (XANAX ) 0.25 MG tablet, Take 1 tablet (0.25 mg total) by mouth 2 (two) times daily as needed for anxiety., Disp: 5 tablet, Rfl: 0   atorvastatin  (LIPITOR) 40 MG tablet, Take 1 tablet (40 mg total) by mouth daily., Disp: 90 tablet, Rfl: 1   cetirizine  (ZYRTEC ) 10 MG tablet, Take 2 tablets twice daily as needed for hives and swelling, Disp: 120 tablet, Rfl: 3   cimetidine (TAGAMET) 400 MG tablet, Take 1 tablet (400 mg total) by mouth daily., Disp: 30 tablet, Rfl: 4   cyclobenzaprine  (FLEXERIL ) 10 MG tablet, Take 1 tablet (10 mg total) by mouth 2 (two) times daily as needed for muscle spasms., Disp: 20 tablet, Rfl: 0   EPINEPHrine  0.3 mg/0.3 mL IJ SOAJ injection, Inject 0.3 mg into the muscle as needed for anaphylaxis., Disp: 1 each, Rfl: 0   fluticasone  (FLONASE ) 50 MCG/ACT nasal spray, Place 2 sprays into both nostrils daily. (Patient taking differently: Place 2 sprays into both nostrils as needed.), Disp: 16 g, Rfl: 0   Multiple Vitamin (MULTIVITAMIN) capsule, Take 1 capsule by mouth daily., Disp: , Rfl:    semaglutide -weight management (WEGOVY ) 2.4 MG/0.75ML  SOAJ SQ injection, Inject 2.4 mg into the skin every 7 (seven) days., Disp: 3 mL, Rfl: 0   tacrolimus  (PROTOPIC ) 0.1 % ointment, Apply topically 2 (two) times daily., Disp: 100 g, Rfl: 1   triamcinolone  (NASACORT ) 55 MCG/ACT AERO nasal inhaler, Place 2 sprays into the nose daily., Disp: 1 each, Rfl: 12   valACYclovir  (VALTREX ) 1000 MG tablet, Take 1 tablet (1,000 mg total) by mouth daily as needed., Disp: 90 tablet, Rfl: 1   VITAMIN D  PO, Take by mouth daily., Disp: , Rfl:    ondansetron  (ZOFRAN -ODT) 4 MG disintegrating tablet, Take 1 tablet (4 mg total) by mouth every 8 (eight) hours as needed for nausea or vomiting., Disp: 20 tablet, Rfl: 0  Review of Systems:  Negative unless indicated in HPI.   Physical Exam: Vitals:   09/25/23 0759  BP: 124/84  Pulse: 80   Temp: 98.1 F (36.7 C)  TempSrc: Oral  SpO2: 100%  Weight: 224 lb 12.8 oz (102 kg)    Body mass index is 35.21 kg/m.   Impression and Plan:  Fear of flying -     ALPRAZolam ; Take 1 tablet (0.25 mg total) by mouth 2 (two) times daily as needed for anxiety.  Dispense: 5 tablet; Refill: 0  Nausea -     Ondansetron ; Take 1 tablet (4 mg total) by mouth every 8 (eight) hours as needed for nausea or vomiting.  Dispense: 20 tablet; Refill: 0   - Will give alprazolam  0.25 mg to take as needed during the flight.  Will also give 20 tablets of Zofran  ODT.  Time spent:21 minutes reviewing chart, interviewing and examining patient and formulating plan of care.     Tully Theophilus Andrews, MD [facility]

## 2023-10-08 ENCOUNTER — Telehealth

## 2023-10-08 DIAGNOSIS — R3989 Other symptoms and signs involving the genitourinary system: Secondary | ICD-10-CM

## 2023-10-09 MED ORDER — NITROFURANTOIN MONOHYD MACRO 100 MG PO CAPS: 100.0000 mg | ORAL_CAPSULE | Freq: Two times a day (BID) | ORAL | 0 refills | Status: AC

## 2023-10-09 NOTE — Progress Notes (Signed)

## 2023-10-22 ENCOUNTER — Encounter: Payer: Self-pay | Admitting: Internal Medicine

## 2023-10-23 ENCOUNTER — Other Ambulatory Visit: Payer: Self-pay | Admitting: Internal Medicine

## 2023-10-23 DIAGNOSIS — E669 Obesity, unspecified: Secondary | ICD-10-CM

## 2023-10-23 MED ORDER — TIRZEPATIDE-WEIGHT MANAGEMENT 5 MG/0.5ML ~~LOC~~ SOAJ: 5.0000 mg | SUBCUTANEOUS | 0 refills | Status: DC

## 2023-10-27 ENCOUNTER — Telehealth: Payer: Self-pay

## 2023-10-27 ENCOUNTER — Other Ambulatory Visit (HOSPITAL_COMMUNITY): Payer: Self-pay

## 2023-10-27 NOTE — Telephone Encounter (Signed)
 Pharmacy Patient Advocate Encounter   Received notification from Pt Calls Messages that prior authorization for Zepbound  5 is required/requested.   Insurance verification completed.   The patient is insured through [employer].   Per test claim:  Is preferred by insurance.   Zepbound  is covered by paid discount program. No PA request submitted at this time.

## 2023-10-28 ENCOUNTER — Other Ambulatory Visit (HOSPITAL_COMMUNITY): Payer: Self-pay

## 2023-10-28 NOTE — Telephone Encounter (Signed)
 Pharmacy Patient Advocate Encounter   Received notification from Pt Calls Messages that prior authorization for Zepbound  5 is required/requested.   Insurance verification completed.   The patient is insured through [employer].   Per test claim: PA required; PA submitted to above mentioned insurance via Latent Key/confirmation #/EOC [id number] Status is pending

## 2023-10-29 ENCOUNTER — Other Ambulatory Visit (HOSPITAL_COMMUNITY): Payer: Self-pay

## 2023-10-29 ENCOUNTER — Other Ambulatory Visit: Payer: Self-pay

## 2023-10-29 MED ORDER — AIRSUPRA 90-80 MCG/ACT IN AERO: 2.0000 | INHALATION_SPRAY | RESPIRATORY_TRACT | 0 refills | Status: DC | PRN

## 2023-10-29 MED ORDER — CIMETIDINE 400 MG PO TABS: 400.0000 mg | ORAL_TABLET | Freq: Every day | ORAL | 1 refills | Status: DC

## 2023-10-29 NOTE — Telephone Encounter (Signed)
 Pharmacy Patient Advocate Encounter   Received notification from Patient Advice Request messages that prior authorization for Zepbound  5 is required/requested.   Insurance verification completed.   The patient is insured through [employer]. Selected [employer].   Per test claim: PA required; PA submitted to above mentioned insurance via Latent Key/confirmation #/EOC [id number] Status is pending

## 2023-10-30 ENCOUNTER — Other Ambulatory Visit (HOSPITAL_COMMUNITY): Payer: Self-pay

## 2023-10-30 ENCOUNTER — Telehealth: Payer: Self-pay

## 2023-10-30 NOTE — Telephone Encounter (Signed)
 Pharmacy Patient Advocate Encounter   Received notification from RX Request Messages that prior authorization for Zepbound  5 is required/requested.   Insurance verification completed.   The patient is insured through [employer].   Per test claim: PA required; PA submitted to above mentioned insurance via Latent Key/confirmation #/EOC [id number] Status is pending   Patient has T[employer] Prime Patient tried phentermine from February to July of 2023.

## 2023-10-31 ENCOUNTER — Other Ambulatory Visit (HOSPITAL_COMMUNITY): Payer: Self-pay

## 2023-10-31 NOTE — Telephone Encounter (Signed)
 Pharmacy Patient Advocate Encounter  Received notification from [employer]/ [employer] that Prior Authorization for Zepbound  5 has been APPROVED from 9123/25 to 10/30/24. Ran test claim, Copay is $43.00. This test claim was processed through [facility]- copay amounts may vary at other pharmacies due to pharmacy/plan contracts, or as the patient moves through the different stages of their insurance plan.   PA #/Case ID/Reference #: # [id number]

## 2023-11-03 NOTE — Telephone Encounter (Signed)
 Note sent to mychart.

## 2023-11-03 NOTE — Telephone Encounter (Signed)
 Noted

## 2023-11-19 ENCOUNTER — Other Ambulatory Visit: Payer: Self-pay | Admitting: Internal Medicine

## 2023-11-20 ENCOUNTER — Encounter: Payer: Self-pay | Admitting: Internal Medicine

## 2023-11-24 ENCOUNTER — Other Ambulatory Visit: Payer: Self-pay | Admitting: Internal Medicine

## 2023-11-24 DIAGNOSIS — E669 Obesity, unspecified: Secondary | ICD-10-CM

## 2023-11-24 MED ORDER — ZEPBOUND 7.5 MG/0.5ML ~~LOC~~ SOAJ: 7.5000 mg | SUBCUTANEOUS | 0 refills | Status: DC

## 2023-12-16 ENCOUNTER — Encounter: Payer: Self-pay | Admitting: Internal Medicine

## 2023-12-16 ENCOUNTER — Other Ambulatory Visit: Payer: Self-pay | Admitting: Internal Medicine

## 2023-12-16 DIAGNOSIS — E669 Obesity, unspecified: Secondary | ICD-10-CM

## 2023-12-16 MED ORDER — ZEPBOUND 10 MG/0.5ML ~~LOC~~ SOAJ: 10.0000 mg | SUBCUTANEOUS | 0 refills | Status: DC

## 2024-01-13 ENCOUNTER — Other Ambulatory Visit: Payer: Self-pay | Admitting: Internal Medicine

## 2024-01-13 ENCOUNTER — Encounter: Payer: Self-pay | Admitting: Internal Medicine

## 2024-01-13 DIAGNOSIS — E669 Obesity, unspecified: Secondary | ICD-10-CM

## 2024-01-13 MED ORDER — ZEPBOUND 12.5 MG/0.5ML ~~LOC~~ SOAJ: 12.5000 mg | SUBCUTANEOUS | 0 refills | Status: DC

## 2024-01-15 ENCOUNTER — Emergency Department (HOSPITAL_BASED_OUTPATIENT_CLINIC_OR_DEPARTMENT_OTHER)
Admission: EM | Admit: 2024-01-15 | Discharge: 2024-01-16 | Disposition: A | Discharge status: Home / Self Care | Attending: Emergency Medicine | Admitting: Emergency Medicine

## 2024-01-15 ENCOUNTER — Encounter (HOSPITAL_BASED_OUTPATIENT_CLINIC_OR_DEPARTMENT_OTHER): Payer: Self-pay

## 2024-01-15 DIAGNOSIS — Z91010 Allergy to peanuts: Secondary | ICD-10-CM | POA: Diagnosis not present

## 2024-01-15 DIAGNOSIS — Z7951 Long term (current) use of inhaled steroids: Secondary | ICD-10-CM | POA: Insufficient documentation

## 2024-01-15 DIAGNOSIS — J45909 Unspecified asthma, uncomplicated: Secondary | ICD-10-CM | POA: Diagnosis not present

## 2024-01-15 DIAGNOSIS — T783XXA Angioneurotic edema, initial encounter: Secondary | ICD-10-CM | POA: Diagnosis not present

## 2024-01-15 DIAGNOSIS — R1310 Dysphagia, unspecified: Secondary | ICD-10-CM | POA: Diagnosis present

## 2024-01-15 LAB — BASIC METABOLIC PANEL WITH GFR
Anion gap: 11 (ref 5–15)
BUN: 27 mg/dL — ABNORMAL HIGH (ref 6–20)
CO2: 26 mmol/L (ref 22–32)
Calcium: 10.2 mg/dL (ref 8.9–10.3)
Chloride: 102 mmol/L (ref 98–111)
Creatinine, Ser: 0.73 mg/dL (ref 0.44–1.00)
GFR, Estimated: >60 mL/min
Glucose, Bld: 91 mg/dL (ref 70–99)
Potassium: 4.2 mmol/L (ref 3.5–5.1)
Sodium: 139 mmol/L (ref 135–145)

## 2024-01-15 LAB — CBC
HCT: 41.6 % (ref 36.0–46.0)
Hemoglobin: 13.8 g/dL (ref 12.0–15.0)
MCH: 29.7 pg (ref 26.0–34.0)
MCHC: 33.2 g/dL (ref 30.0–36.0)
MCV: 89.7 fL (ref 80.0–100.0)
Platelets: 293 K/uL (ref 150–400)
RBC: 4.64 MIL/uL (ref 3.87–5.11)
RDW: 13.2 % (ref 11.5–15.5)
WBC: 7.5 K/uL (ref 4.0–10.5)
nRBC: 0 % (ref 0.0–0.2)

## 2024-01-15 MED ORDER — FAMOTIDINE IN NACL 20-0.9 MG/50ML-% IV SOLN
20.0000 mg | Freq: Once | INTRAVENOUS | Status: AC
  Administered 2024-01-15: 20 mg via INTRAVENOUS
  Filled 2024-01-15: qty 50

## 2024-01-15 MED ORDER — DIPHENHYDRAMINE HCL 50 MG/ML IJ SOLN
25.0000 mg | Freq: Once | INTRAMUSCULAR | Status: AC
  Administered 2024-01-15: 25 mg via INTRAVENOUS
  Filled 2024-01-15: qty 1

## 2024-01-15 MED ORDER — METHYLPREDNISOLONE SODIUM SUCC 125 MG IJ SOLR
125.0000 mg | Freq: Once | INTRAMUSCULAR | Status: AC
  Administered 2024-01-15: 125 mg via INTRAVENOUS
  Filled 2024-01-15: qty 2

## 2024-01-15 MED ORDER — ONDANSETRON HCL 4 MG/2ML IJ SOLN
4.0000 mg | Freq: Once | INTRAMUSCULAR | Status: AC
  Administered 2024-01-15: 4 mg via INTRAVENOUS
  Filled 2024-01-15: qty 2

## 2024-01-15 NOTE — ED Provider Notes (Signed)
 " [place] EMERGENCY DEPARTMENT AT [facility] Provider Note   CSN: [id number] Arrival date & time: 01/15/24  1926     Patient presents with: Allergic Reaction   Brittney [name] is a 54 y.o. female.     Patient with hx of recurrent hives and previous angioedema of unknown cause presents emergency department with sudden onset lip swelling throat swelling hoarse voice and difficulty breathing starting about 30 minutes prior to arrival.  Patient drove herself and brought her EpiPen  but did not use it.  She denies abdominal pain, nausea, vomiting, diarrhea,, loss of consciousness.  Patient has a history of tree nut allergies but had no known exposures.  Patient has been worked up in the past with allergy and immunology  The history is provided by the patient and a relative.  Allergic Reaction Presenting symptoms: difficulty breathing, difficulty swallowing and swelling   Severity:  Moderate Duration:  30 minutes Prior allergic episodes:  Unable to specify Context: not animal exposure, not chemicals, not cosmetics, not dairy/milk products, not eggs, not food allergies, not grass, not insect bite/sting, not jewelry/metal, not medications, not new detergents/soaps, not nuts and not poison ivy   Relieved by:  None tried Ineffective treatments:  None tried  I personally delivered epipen  to the patient in the Left thigh 7:36PM     Prior to Admission medications  Medication Sig Start Date End Date Taking? Authorizing Provider  AIRSUPRA  90-80 MCG/ACT AERO Inhale 2 puffs into the lungs as needed (maximum 12 puffs/day). 10/29/23   Marinda Rocky SAILOR, MD  albuterol  (PROVENTIL ) (2.5 MG/3ML) 0.083% nebulizer solution Take 3 mLs (2.5 mg total) by nebulization every 6 (six) hours as needed for wheezing or shortness of breath. 01/17/23   Raspet, Erin K, PA-C  ALPRAZolam  (XANAX ) 0.25 MG tablet Take 1 tablet (0.25 mg total) by mouth 2 (two) times daily as needed for anxiety. 09/25/23    Theophilus Andrews, Tully GRADE, MD  atorvastatin  (LIPITOR) 40 MG tablet Take 1 tablet (40 mg total) by mouth daily. 04/30/22   Theophilus Andrews, Tully GRADE, MD  cetirizine  (ZYRTEC ) 10 MG tablet Take 2 tablets twice daily as needed for hives and swelling 07/18/23   Marinda Rocky SAILOR, MD  cimetidine  (TAGAMET ) 400 MG tablet TAKE 1 TABLET BY MOUTH EVERY DAY 11/19/23   Marinda Rocky SAILOR, MD  cyclobenzaprine  (FLEXERIL ) 10 MG tablet Take 1 tablet (10 mg total) by mouth 2 (two) times daily as needed for muscle spasms. 07/24/22   Mesner, Selinda, MD  EPINEPHrine  0.3 mg/0.3 mL IJ SOAJ injection Inject 0.3 mg into the muscle as needed for anaphylaxis. 07/18/23   Marinda Rocky SAILOR, MD  fluticasone  (FLONASE ) 50 MCG/ACT nasal spray Place 2 sprays into both nostrils daily. Patient taking differently: Place 2 sprays into both nostrils as needed. 12/19/20   Gladis Elsie BROCKS, PA-C  Multiple Vitamin (MULTIVITAMIN) capsule Take 1 capsule by mouth daily.    [provider]  nitrofurantoin , macrocrystal-monohydrate, (MACROBID ) 100 MG capsule Take 1 capsule (100 mg total) by mouth 2 (two) times daily. 10/09/23   Vivienne Delon HERO, PA-C  ondansetron  (ZOFRAN -ODT) 4 MG disintegrating tablet Take 1 tablet (4 mg total) by mouth every 8 (eight) hours as needed for nausea or vomiting. 09/25/23   Theophilus Andrews, Tully GRADE, MD  tacrolimus  (PROTOPIC ) 0.1 % ointment Apply topically 2 (two) times daily. 05/14/23   Theophilus Andrews, Tully GRADE, MD  tirzepatide  (ZEPBOUND ) 12.5 MG/0.5ML Pen Inject 12.5 mg into the skin once a week. 01/13/24  Theophilus Andrews, Tully GRADE, MD  triamcinolone  (NASACORT ) 55 MCG/ACT AERO nasal inhaler Place 2 sprays into the nose daily. 07/18/23   Marinda Rocky SAILOR, MD  valACYclovir  (VALTREX ) 1000 MG tablet Take 1 tablet (1,000 mg total) by mouth daily as needed. 04/03/23   Theophilus Andrews, Tully GRADE, MD  VITAMIN D  PO Take by mouth daily.    [provider]    Allergies: Other, Peanut-containing drug products, and Sulfa  antibiotics    Review of Systems  HENT:  Positive for trouble swallowing.     Updated Vital Signs BP (!) 139/91 (BP Location: Right Arm)   Pulse 83   Temp 97.8 F (36.6 C)   Resp 20   LMP 08/07/2021   SpO2 100%   Physical Exam Vitals and nursing note reviewed.  Constitutional:      General: She is not in acute distress.    Appearance: She is well-developed. She is not diaphoretic.  HENT:     Head: Normocephalic and atraumatic.     Right Ear: External ear normal.     Left Ear: External ear normal.     Nose: Nose normal.     Mouth/Throat:     Mouth: Mucous membranes are moist.  Eyes:     General: No scleral icterus.    Conjunctiva/sclera: Conjunctivae normal.  Cardiovascular:     Rate and Rhythm: Normal rate and regular rhythm.     Heart sounds: Normal heart sounds. No murmur heard.    No friction rub. No gallop.  Pulmonary:     Effort: Pulmonary effort is normal. No respiratory distress.     Breath sounds: Normal breath sounds.  Abdominal:     General: Bowel sounds are normal. There is no distension.     Palpations: Abdomen is soft. There is no mass.     Tenderness: There is no abdominal tenderness. There is no guarding.  Musculoskeletal:     Cervical back: Normal range of motion.  Skin:    General: Skin is warm and dry.  Neurological:     Mental Status: She is alert and oriented to person, place, and time.  Psychiatric:        Behavior: Behavior normal.     (all labs ordered are listed, but only abnormal results are displayed) Labs Reviewed - No data to display  EKG: None  Radiology: No results found.   .Critical Care  Performed by: Arloa Chroman, PA-C Authorized by: Arloa Chroman, PA-C   Critical care provider statement:    Critical care time (minutes):  75   Critical care time was exclusive of:  Separately billable procedures and treating other patients   Critical care was necessary to treat or prevent imminent or life-threatening  deterioration of the following conditions: angioedema.   Critical care was time spent personally by me on the following activities:  Development of treatment plan with patient or surrogate, discussions with consultants, evaluation of patient's response to treatment, examination of patient, ordering and review of laboratory studies, ordering and review of radiographic studies, ordering and performing treatments and interventions, pulse oximetry, re-evaluation of patient's condition and review of old charts    Medications Ordered in the ED - No data to display                                  Medical Decision Making Amount and/or Complexity of Data Reviewed Labs: ordered.  Risk Prescription drug management.  Patient presents to the emergency department with spontaneous onset of angioedema throat closing difficulty breathing no known trigger. Differential diagnosis includes anaphylaxis, ACE inhibitor induced angioedema, mast cell degranulation, hereditary angioedema, idiopathic angioedema.  Past Medical History:  Diagnosis Date   Allergy    seasonal   Anemia    Anxiety    Asthma    Chronic urticaria 07/18/2023   Insomnia    Obesity (BMI 35.0-39.9 without comorbidity)    Oral herpes    SDOH Screenings   Food Insecurity: No Food Insecurity (07/16/2023)  Housing: Low Risk (07/16/2023)  Transportation Needs: No Transportation Needs (07/16/2023)  Utilities: Not At Risk (06/09/2023)   Received from [facility]  Alcohol Screen: Low Risk (07/16/2023)  Depression (PHQ2-9): Low Risk (03/26/2023)  Financial Resource Strain: Low Risk (07/16/2023)  Physical Activity: Sufficiently Active (07/16/2023)  Social Connections: Moderately Integrated (07/16/2023)  Stress: No Stress Concern Present (07/16/2023)  Tobacco Use: Low Risk (01/15/2024)   \  History obtained from the patient as well as her husband at bedside and review of previous allergy/immunology records.   I ordered and reviewed  and interpreted patient's labs.  BMP CBC without acute finding.   Patient was treated in the emergency department with Medications  diphenhydrAMINE  (BENADRYL ) injection 25 mg (25 mg Intravenous Given 01/15/24 1955)  ondansetron  (ZOFRAN ) injection 4 mg (4 mg Intravenous Given 01/15/24 1954)  famotidine  (PEPCID ) IVPB 20 mg premix (0 mg Intravenous Stopped 01/15/24 2109)  methylPREDNISolone  sodium succinate (SOLU-MEDROL ) 125 mg/2 mL injection 125 mg (125 mg Intravenous Given 01/15/24 1954)   With improvement but at reevaluation patient had continued swelling in her lips and continued to have some difficulty with swallowing and tightness with breathing.   Patient was given IM epinephrine  in triage when I was asked to evaluate the patient.  This was her home medication which she allowed me to use for emergent purposes. At handoff at shift change to Dr. Raynell Naegeli who will continue to evaluate the patient.  She is slowly improving and has not had any change or decompensation of her breathing status.  Suspect that the patient will be able to discharge if she continues in her improvement.  I have ordered a Medrol  pack and refills on her epinephrine  pen.  She will also be discharged with Lozol and Pepcid  twice daily to take improvement       Final diagnoses:  None    ED Discharge Orders     None          Arloa Chroman, PA-C 01/16/24 1238    Lenor Hollering, MD 01/16/24 1511  "

## 2024-01-15 NOTE — ED Notes (Signed)
 Pt has her EPI pen here. EDP administered 0.3mg  IM

## 2024-01-15 NOTE — ED Triage Notes (Signed)
 Pt states he just got off work and has not ate or drank anything in >6 hrs. 2 zyrtec  and Pepcid  AC PTA +lips swollen States she is having a hard time breathing and feels like her throat is closing up.

## 2024-01-16 ENCOUNTER — Ambulatory Visit: Admitting: Internal Medicine

## 2024-01-16 ENCOUNTER — Encounter: Payer: Self-pay | Admitting: Internal Medicine

## 2024-01-16 ENCOUNTER — Telehealth: Payer: Self-pay | Admitting: *Deleted

## 2024-01-16 ENCOUNTER — Other Ambulatory Visit: Payer: Self-pay

## 2024-01-16 VITALS — BP 114/68 | HR 79 | Temp 97.6°F | Resp 20 | Wt 220.6 lb

## 2024-01-16 DIAGNOSIS — T782XXD Anaphylactic shock, unspecified, subsequent encounter: Secondary | ICD-10-CM | POA: Diagnosis not present

## 2024-01-16 DIAGNOSIS — L508 Other urticaria: Secondary | ICD-10-CM | POA: Diagnosis not present

## 2024-01-16 DIAGNOSIS — J4520 Mild intermittent asthma, uncomplicated: Secondary | ICD-10-CM

## 2024-01-16 MED ORDER — FAMOTIDINE 20 MG PO TABS: 20.0000 mg | ORAL_TABLET | Freq: Two times a day (BID) | ORAL | 0 refills | Status: DC

## 2024-01-16 MED ORDER — EPINEPHRINE 0.3 MG/0.3ML IJ SOAJ: 0.3000 mg | INTRAMUSCULAR | 2 refills | Status: AC | PRN

## 2024-01-16 MED ORDER — FAMOTIDINE 20 MG PO TABS: 20.0000 mg | ORAL_TABLET | Freq: Two times a day (BID) | ORAL | 0 refills | Status: AC

## 2024-01-16 MED ORDER — LEVOCETIRIZINE DIHYDROCHLORIDE 5 MG PO TABS: 5.0000 mg | ORAL_TABLET | Freq: Two times a day (BID) | ORAL | 0 refills | Status: AC

## 2024-01-16 MED ORDER — AIRSUPRA 90-80 MCG/ACT IN AERO: 2.0000 | INHALATION_SPRAY | RESPIRATORY_TRACT | 0 refills | Status: AC | PRN

## 2024-01-16 MED ORDER — MONTELUKAST SODIUM 10 MG PO TABS: 10.0000 mg | ORAL_TABLET | Freq: Every day | ORAL | 5 refills | Status: AC

## 2024-01-16 MED ORDER — METHYLPREDNISOLONE 4 MG PO TBPK: ORAL_TABLET | ORAL | 0 refills | Status: AC

## 2024-01-16 NOTE — Discharge Instructions (Addendum)
 ### Understanding Angioedema: A Patient Guide     ## What is Angioedema?        Angioedema is swelling that occurs in the deeper layers of your skin and the tissues beneath it. Unlike regular swelling, angioedema does not leave an indentation when you press on it (non-pitting), and it typically does not itch. The swelling is caused by fluid leaking from blood vessels into surrounding tissues.[1][2]      Angioedema can affect different parts of your body, most commonly:      - Face (especially lips and around the eyes)      - Tongue and throat      - Hands and feet      - Abdomen (intestines)      ## What Causes Angioedema?        There are several types of angioedema with different causes:[2][3]      **Allergic angioedema** - Triggered by allergic reactions to foods, medications, insect stings, or other allergens. This type often comes with hives (raised, itchy welts) and develops quickly (within minutes).[4]      **Medication-related angioedema** - Certain blood pressure medications called ACE inhibitors (names ending in -pril like lisinopril) can cause angioedema. This can occur even after taking the medication for months or years.[1][2]      **Hereditary angioedema (HAE)** - A genetic condition passed down in families, caused by a deficiency in a protein called C1 inhibitor. This type runs in families and usually starts in childhood or adolescence.[2][5]      **Idiopathic angioedema** - When no specific cause can be identified. This is actually the most common type.[2]      ## What Are the Symptoms?        The main symptom is swelling that:[6][1]      - Develops over hours (or minutes if allergic)      - Does not pit when pressed      - Usually does not itch (unless allergic)      - Can last from several hours up to 7 days      - May be accompanied by abdominal pain if the intestines are affected      **Warning signs that require immediate medical attention:**       - Swelling of the tongue, throat, or lips      - Difficulty breathing or swallowing      - Tightness in the throat      - Difficulty speaking      - Wheezing or shortness of breath      - Dizziness or fainting      ## How is Angioedema Treated?        Treatment depends on the type of angioedema you have:[7]      **For allergic angioedema:**      - Antihistamines (like diphenhydramine /Benadryl )      - Corticosteroids (like prednisone )      - Epinephrine  injection for severe reactions      - Avoiding the trigger (food, medication, etc.)      **For ACE inhibitor-related angioedema:**      - Stop the medication immediately (under your doctor's guidance)      - Your doctor will prescribe a different blood pressure medication      - Swelling may continue for weeks after stopping the medication[2]      **For hereditary angioedema:**      - Specialized medications that replace the missing protein or block the swelling process      -  Your doctor may prescribe medications to prevent attacks      - You may be given emergency medication to keep at home[1][5]      **For idiopathic angioedema:**      - High-dose antihistamines are often tried first      - Your doctor will work with you to find the most effective treatment[2]      ## When Should You Seek Medical Care?        **Call 911 or go to the emergency room immediately if you have:**      - Any swelling involving your tongue, throat, or mouth      - Difficulty breathing, swallowing, or speaking      - Swelling that comes with hives, wheezing, or feeling faint (signs of a severe allergic reaction)      **Contact your doctor soon if you have:**      - New or recurring episodes of swelling      - Swelling that does not improve within 24-48 hours      - Severe abdominal pain with swelling      - A family history of angioedema      ## Living with Angioedema        If you have recurrent angioedema:       - Keep a diary of episodes to help identify triggers      - Wear a medical alert bracelet, especially if you have hereditary angioedema      - Carry emergency medications if prescribed by your doctor      - Inform all healthcare providers about your condition      - Avoid known triggers when identified      - Have an action plan discussed with your doctor for managing attacks      ## Important Points to Remember        - Angioedema affecting the airway can be life-threatening and requires immediate medical attention[4][7]      - Not all angioedema is the same - the treatment depends on the cause[7]      - If you take ACE inhibitor blood pressure medications and develop swelling, contact your doctor immediately[2]      - Hereditary angioedema requires specialized treatment that is different from allergic swelling[6]      - Keep all follow-up appointments with your doctor or allergist to optimize your treatment plan      If you have questions about your angioedema or treatment plan, please discuss them with your healthcare provider.      ### References  1. Acute Upper Airway Obstruction. Eskander A, [place], Irish JC. The [place] Journal of Medicine. 2019;381(20):1940-1949. doi:10.1056/NEJMra1811697. 2. Angioedema Without Urticaria: Diagnosis and Management. Young MC, Banerji A. Allergy and Asthma Proceedings. 2025;46(3):185-191. doi:10.2500/aap.2025.46.250013. 3. Angioedema With Normal Complement Studies: What Do We Know?SABRA Chiles CH, Grumach AS, Bork K. The Journal of Allergy and Clinical Immunology. In Practice. 2023;11(8):2309-2314. doi:10.1016/j.jaip.2023.06.022. 4. Angioedema and Emergency Medicine: From Pathophysiology to Diagnosis and Treatment. Depetri F, Neila LABOR, Cugno M. European Journal of Internal Medicine. 7980;40:1-86. doi:10.1016/j.ejim.2018.09.004. 5. Hereditary Angioedema. Elouise BRASS, Christiansen [place]. The [place] Journal of Medicine.  2020;382(12):1136-1148. doi:10.1056/NEJMra1808012. 6. A Focused Parameter Update: Hereditary Angioedema, Acquired C1 Inhibitor Deficiency, and Angiotensin-Converting Enzyme Inhibitor-Associated Angioedema. Zuraw BL, Thornell SHILLING, Lang DM, et al. The Journal of Allergy and Clinical Immunology. 2013;131(6):1491-3. doi:10.1016/j.jaci.2013.03.034. 7. A Consensus Parameter for the Evaluation and Management of Angioedema in the Emergency Department. Annett RENETTE Thornell  JA, Metta BROCKS, et al. Academic Emergency Medicine : Official Journal of the Society for Academic Emergency Medicine. 2014;21(4):469-84. doi:10.1111/acem.12341.

## 2024-01-16 NOTE — Telephone Encounter (Signed)
-----   Message from Rocky Endow, MD sent at 01/16/2024  1:28 PM EST ----- Can we set-up a nurse visit to do the HAT buccal swab? I will brining the kit to our office next Wednesday, so we can do it any time Wednesday or after. Cost is 169 and she will have to provide her credit card information to process.

## 2024-01-16 NOTE — Progress Notes (Signed)
 "  FOLLOW UP Date of Service/Encounter:   01/16/2024  Subjective:  Brittney [name] (DOB: [birth year]) is a 54 y.o. female who [facility]24 in re-evaluation of the following: Acute visit for throat and tongue swelling History obtained from: chart review and patient.  For Review, LV was on 09/03/2023 with Dr.Denorris Reust seen for routine follow-up. See below for summary of history and diagnostics.   Therapeutic plans/changes recommended: Having recurrent hives if not on Zyrtec  2 tablets twice daily, frequent missed doses due to work travel.  Hives controlled on 2 tablets of Zyrtec  daily to twice daily. ----------------------------------------------------- Pertinent History/Diagnostics:  Urticaria:  Recurrent hives and facial swelling without clear triggers, indicating chronic idiopathic urticaria. Symptoms likely non-allergic. Current management partially effective.  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 Managed with Benadryl  and allergen avoidance. -Blood work negative to nut panel, skin testing recommended followed by oral challenge if indicated Other: Sulfa intolerance --------------------------------------------------- Today presents for follow-up. Discussed the use of AI scribe software for clinical note transcription with the patient, who gave verbal consent to proceed.  History of Present Illness Brittney [name] is a 54 year old female with chronic hives who presents with an episode of anaphylaxis.  Anaphylaxis episode - Severe allergic reaction  occurred yesterday evening, approximately 4.5 hours after consuming a hamburger at 2 PM; symptoms began around 6:30 PM. - Initial symptoms included lip swelling, progressing rapidly within 20-25 minutes to facial swelling, tongue swelling, and sensation of throat closure. - No hives, pruritus, or gastrointestinal symptoms during the episode. - Voice became hoarse; unable to speak and indicated inability to breathe upon arrival at the emergency department. - Did not use EpiPen  at home due to being alone and uncertainty about the reaction; drove herself to the emergency department. - Treated in the emergency department with EpiPen , IV prednisone , Pepcid  AC, Zofran , and Benadryl . - Observed for two additional hours due to persistent respiratory distress and fluctuating blood pressure (low); discharged six hours after EpiPen  administration. - Not on medications for blood pressure, not taking ACE inhibitors -Does take bovine collagen but has been taking this daily for a while -Taking iron and vitamin supplement, but took these in the early morning prior to this episode (greater than 8 hours prior to episode)  Chronic urticaria - History of chronic hives managed with Zyrtec  as needed. - Completed a six-week course of Zyrtec  in the past; currently taking Zyrtec  intermittently. - No hives or pruritus during the recent anaphylactic episode.  Medication use and adverse effects - Zyrtec  causes significant fatigue; relies on caffeine to counteract sedation. - Not taking Zyrtec  consistently at the time of the recent episode. - Took two Zyrtec  and a Pepcid  AC at onset of lip swelling during the recent episode. - Regularly takes iron supplements and vitamins; took these the morning of the episode. - No use of ACE inhibitors.  Other medical history - On Zepbound  for asthma. - History of anemia throughout most of her life. - No recent tick or chigger bites.  All medications reviewed by clinical staff and  updated in chart. No new pertinent medical or surgical  history except as noted in HPI.  ROS: All others negative except as noted per HPI.   Objective:  BP 114/68   Pulse 79   Temp 97.6 F (36.4 C) (Oral)   Resp 20   Wt 220 lb 9 oz (100 kg)   LMP 08/07/2021   SpO2 99%   BMI 34.54 kg/m  Body mass index is 34.54 kg/m. Physical Exam: General Appearance:  Alert, cooperative, no distress, appears stated age  Head:  Normocephalic, without obvious abnormality, atraumatic  Eyes:  Conjunctiva clear, EOM's intact  Ears EACs normal bilaterally and normal TMs bilaterally  Nose: Nares normal, hypertrophic turbinates, normal mucosa, and no visible anterior polyps  Throat: Lips, tongue normal; teeth and gums normal, normal posterior oropharynx  Neck: Supple, symmetrical  Lungs:   clear to auscultation bilaterally, Respirations unlabored, no coughing  Heart:  regular rate and rhythm and no murmur, Appears well perfused  Extremities: No edema  Skin: Skin color, texture, turgor normal and no rashes or lesions on visualized portions of skin  Neurologic: No gross deficits   Labs:  Lab Orders         Alpha-Gal Panel      Assessment/Plan   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  Recent episode of anaphylaxis, unknown trigger. Possibly alpha-gal. Will retest.  Avoid mammalian meat and bovine collagen until testing returns. Consider genetic testing for Hereditary Alpha Tryptasemia-will set up nurse visit once we have the kits Submit for Xolair   Therapy Plan:  - Continue levocetirizine 10 mg (2 tablets) twice daily. - continue pepcid  20 mg twice daily - add singulair  10 mg nightly-stop if behaviour/mood changes or nightmares. - if hives are still uncontrolled with the above regimen, please arrange an appointment for discussion of Xolair  (omalizumab )- an injectable  medication for hives  For symptoms of airway involvement, use epipen  and call 911-recently refilled  Can use one of the following in place of zyrtec  if desires: Claritin  (loratadine ) 10 mg, Xyzal  (levocetirizine) 5 mg or Allegra (fexofenadine) 180 mg daily as needed  Peanut and tree nut allergy History of lip swelling and tongue tingling upon exposure. Managed with Benadryl  and allergen avoidance. - Continue to avoid peanuts and tree nuts. - Ensure EpiPen  is available for emergency use. - Blood testing was negative, consider skin testing followed by oral challenge if indicated  Sulfa intolerance Avoidance recommended  Asthma Seasonal asthma exacerbated by outdoor allergens. Current management with albuterol ; discussed need for Airsupra  for better control. Breathing test showed mild obstruction.  - Switch to Airsupra  for asthma management.  Seasonal and perennial allergic rhinitis Worse in fall.  - continue xyzal  as above -can use nasacort  1 spray in each nostril twice daily as needed - Previous lab testing positive to grass pollen, weed pollen, tree pollen, dust mite, cat, dog and molds, allergen avoidance -Consider allergy injections to reduce lifetime symptoms and need for medications by teaching your immune system to become tolerant of the environmental allergens you are allergic to   Follow up : in February as scheduled It was a pleasure seeing you again in clinic today! Thank you for allowing me to participate in your care.  Rocky Endow, MD [facility]  Other: none  Rocky Endow, MD  [facility]      "

## 2024-01-16 NOTE — Patient Instructions (Addendum)
 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  Recent episode of anaphylaxis, unknown trigger. Possibly alpha-gal. Will retest.  Avoid mammalian meat and bovine collagen until testing returns. Consider genetic testing for Hereditary Alpha Tryptasemia-will set up nurse visit once we have the kits Submit for Xolair   Therapy Plan:  - Continue levocetirizine 10 mg (2 tablets) twice daily. - continue pepcid  20 mg twice daily - add singulair  10 mg nightly-stop if behaviour/mood changes or nightmares. - if hives are still uncontrolled with the above regimen, please arrange an appointment for discussion of Xolair  (omalizumab )- an injectable medication for hives  For symptoms of airway involvement, use epipen  and call 911  Can use one of the following in place of zyrtec  if desires: Claritin  (loratadine ) 10 mg, Xyzal  (levocetirizine) 5 mg or Allegra (fexofenadine) 180 mg daily as needed  Peanut and tree nut allergy History of lip swelling and tongue tingling upon exposure. Managed with Benadryl  and allergen avoidance. - Continue to avoid peanuts and tree nuts. - Ensure EpiPen  is available for emergency use. - Blood testing was negative, consider skin testing followed by oral challenge if indicated  Sulfa intolerance Avoidance recommended  Asthma Seasonal asthma exacerbated by outdoor allergens. Current management with albuterol ; discussed need for Airsupra  for better control. Breathing test showed mild obstruction.  - Switch to Airsupra  for asthma management.  Seasonal and perennial allergic rhinitis Worse in fall.  - continue xyzal  as above -can use nasacort  1 spray in each nostril twice daily as needed - Previous lab testing positive to grass pollen, weed pollen, tree pollen, dust mite, cat, dog and molds, allergen avoidance -Consider allergy injections to reduce lifetime  symptoms and need for medications by teaching your immune system to become tolerant of the environmental allergens you are allergic to   Follow up : in February as scheduled It was a pleasure seeing you again in clinic today! Thank you for allowing me to participate in your care.  Rocky Endow, MD [facility]

## 2024-01-16 NOTE — Telephone Encounter (Signed)
 Tried calling no answer, left a voicemail for a return call.

## 2024-01-16 NOTE — ED Provider Notes (Signed)
 I assumed care of this patient from previous provider.  Please see their note for further details of history, exam, and MDM.   Briefly patient is a 54 y.o. female who presented anaphylactic reaction. Improving after meds. Plan to reassess.  1:14 AM Feeling much better. SOB resolved.  The patient appears reasonably screened and/or stabilized for discharge and I doubt any other medical condition or other [facility] requiring further screening, evaluation, or treatment in the ED at this time. I have discussed the findings, Dx and Tx plan with the patient/family who expressed understanding and agree(s) with the plan. Discharge instructions discussed at length. The patient/family was given strict return precautions who verbalized understanding of the instructions. No further questions at time of discharge.  Disposition: Discharge  Condition: Good  ED Discharge Orders          Ordered    methylPREDNISolone  (MEDROL  DOSEPAK) 4 MG TBPK tablet        01/16/24 0013    famotidine  (PEPCID ) 20 MG tablet  2 times daily        01/16/24 0013    levocetirizine (XYZAL ) 5 MG tablet  2 times daily        01/16/24 0013    EPINEPHrine  0.3 mg/0.3 mL IJ SOAJ injection  As needed        01/16/24 0013              Follow Up: Marinda Rocky SAILOR, MD [address] [place] [address] [phone]  Schedule an appointment as soon as possible for a visit            Tearia Gibbs, Raynell Moder, MD 01/16/24 [phone]

## 2024-01-19 ENCOUNTER — Ambulatory Visit: Admitting: Internal Medicine

## 2024-01-19 ENCOUNTER — Ambulatory Visit: Payer: Self-pay | Admitting: Internal Medicine

## 2024-01-19 ENCOUNTER — Telehealth: Payer: Self-pay

## 2024-01-19 LAB — ALPHA-GAL PANEL
Allergen Lamb IgE: <0.1 kU/L
Beef IgE: <0.1 kU/L
IgE (Immunoglobulin E), Serum: 96 [IU]/mL (ref 6–495)
O215-IgE Alpha-Gal: <0.1 kU/L
Pork IgE: <0.1 kU/L

## 2024-01-19 NOTE — Progress Notes (Signed)
 Please let Brittney [name] know that her alpha gal panel has returned and is negative. It is okay for her to go back to eating mammalian meat and her bovine collagen supplements if she would like. This means we do not have a cause for her anaphylaxis, and I will bring the swab kits to [facility] to do the genetic testing we talked about. We just need her to set-up a nursing visit. The kits will be there by Wednesday.

## 2024-01-19 NOTE — Telephone Encounter (Signed)
 Noted. Pt scheduled.

## 2024-01-19 NOTE — Telephone Encounter (Signed)
 Mychart message sent.

## 2024-01-19 NOTE — Telephone Encounter (Signed)
 Pt informed of results and nurse visit scheduled

## 2024-01-19 NOTE — Addendum Note (Signed)
 Addended by: OTHA MADELIN HERO on: 01/19/2024 11:01 AM   Modules accepted: Orders

## 2024-01-21 ENCOUNTER — Telehealth: Payer: Self-pay

## 2024-01-21 ENCOUNTER — Other Ambulatory Visit (HOSPITAL_COMMUNITY): Payer: Self-pay

## 2024-01-21 NOTE — Telephone Encounter (Signed)
*  [facility] Spec  Pharmacy Patient Advocate Encounter   Received notification from Pt Calls Messages that prior authorization for Xolair  300mg  syringe is required/requested.   Insurance verification completed.   The patient is insured through [facility] [place].   Per test claim: PA required; PA submitted to above mentioned insurance via Latent Key/confirmation #/EOC [id number] Status is pending

## 2024-01-21 NOTE — Telephone Encounter (Signed)
 PA request has been Received. New Encounter has been or will be created for follow up. For additional info see Pharmacy Prior Auth telephone encounter from 01/14.

## 2024-01-22 ENCOUNTER — Ambulatory Visit

## 2024-01-23 ENCOUNTER — Other Ambulatory Visit (HOSPITAL_COMMUNITY): Payer: Self-pay

## 2024-01-23 ENCOUNTER — Other Ambulatory Visit: Payer: Self-pay

## 2024-01-23 ENCOUNTER — Ambulatory Visit: Attending: Internal Medicine

## 2024-01-23 DIAGNOSIS — Z79899 Other long term (current) drug therapy: Secondary | ICD-10-CM

## 2024-01-23 DIAGNOSIS — L508 Other urticaria: Secondary | ICD-10-CM

## 2024-01-23 MED ORDER — XOLAIR 300 MG/2ML ~~LOC~~ SOSY
300.0000 mg | PREFILLED_SYRINGE | SUBCUTANEOUS | 11 refills | Status: AC
  Filled 2024-01-23: qty 2, 28d supply, fill #0

## 2024-01-23 NOTE — Progress Notes (Signed)
 Specialty Pharmacy Initial Fill Coordination Note  Brittney [name] is a 54 y.o. female contacted today regarding initial fill of specialty medication(s) Omalizumab  (Xolair )   Patient requested Courier to Provider Office   Delivery date: 01/27/24   Verified address: [address], [place] [address]   Medication will be filled on: 01/26/24   Patient is aware of $0.00 copayment.

## 2024-01-23 NOTE — Progress Notes (Signed)
 [facility] - New Start Biologic  Referring Provider: Rocky Endow  Virtual Visit via Telephone Note  I connected with Brittney [name] on 01/23/24 at 10:00 AM EST by telephone and verified that I am speaking with the correct person using two identifiers.  Location: Patient: home Provider: office   I discussed the limitations, risks, security and privacy concerns of performing an evaluation and management service by telephone and the availability of in person appointments. I also discussed with the patient that there may be a patient responsible charge related to this service. The patient expressed understanding and agreed to proceed.  HPI: Brittney [name] is a 54 y.o. female who presents to the pharmacotherapy clinic via telephone for new start Xolair  initial education. Last OV with Rocky Endow was on 01/16/24.   Indication: chronic urticaria Dosing: 300mg  every 28 days   Patient Active Problem List   Diagnosis Date Noted   Chronic urticaria 07/18/2023   Anaphylactic reaction due to food 07/18/2023   Appendicitis 10/20/2020   Acute appendicitis 10/20/2020   COVID-19 virus infection 05/10/2020   Hyperlipidemia 02/24/2020   Obesity (BMI 35.0-39.9 without comorbidity)    Insomnia    Oral herpes    Anemia    Asthma     Patient's Medications  New Prescriptions   OMALIZUMAB  (XOLAIR ) 300 MG/2  ML PREFILLED SYRINGE    Inject 300 mg into the skin every 28 (twenty-eight) days.  Previous Medications   AIRSUPRA  90-80 MCG/ACT AERO    Inhale 2 puffs into the lungs as needed (maximum 12 puffs/day).   ALBUTEROL  (PROVENTIL ) (2.5 MG/3ML) 0.083% NEBULIZER SOLUTION    Take 3 mLs (2.5 mg total) by nebulization every 6 (six) hours as needed for wheezing or shortness of breath.   ALPRAZOLAM  (XANAX ) 0.25 MG TABLET    Take 1 tablet (0.25 mg total) by mouth 2 (two) times daily as needed for anxiety.   ATORVASTATIN  (LIPITOR) 40 MG TABLET    Take 1 tablet (40 mg total) by  mouth daily.   CIMETIDINE  (TAGAMET ) 400 MG TABLET    TAKE 1 TABLET BY MOUTH EVERY DAY   CYCLOBENZAPRINE  (FLEXERIL ) 10 MG TABLET    Take 1 tablet (10 mg total) by mouth 2 (two) times daily as needed for muscle spasms.   EPINEPHRINE  0.3 MG/0.3 ML IJ SOAJ INJECTION    Inject 0.3 mg into the muscle as needed for anaphylaxis.   EPINEPHRINE  0.3 MG/0.3 ML IJ SOAJ INJECTION    Inject 0.3 mg into the muscle as needed for anaphylaxis.   FAMOTIDINE  (PEPCID ) 20 MG TABLET    Take 1 tablet (20 mg total) by mouth 2 (two) times daily.   FLUTICASONE  (FLONASE ) 50 MCG/ACT NASAL SPRAY    Place 2 sprays into both nostrils daily.   LEVOCETIRIZINE (XYZAL ) 5 MG TABLET    Take 1 tablet (5 mg total) by mouth in the morning and at bedtime.   METHYLPREDNISOLONE  (MEDROL  DOSEPAK) 4 MG TBPK TABLET    Use as directed   MONTELUKAST  (SINGULAIR ) 10 MG TABLET    Take 1 tablet (10 mg total) by mouth at bedtime.   MULTIPLE VITAMIN (MULTIVITAMIN) CAPSULE    Take 1 capsule by mouth daily.   NITROFURANTOIN , MACROCRYSTAL-MONOHYDRATE, (MACROBID ) 100 MG CAPSULE    Take 1 capsule (100 mg total) by mouth 2 (two) times daily.   ONDANSETRON  (ZOFRAN -ODT) 4 MG DISINTEGRATING TABLET    Take 1 tablet (4 mg total) by mouth every 8 (eight) hours as needed for nausea  or vomiting.   TACROLIMUS  (PROTOPIC ) 0.1 % OINTMENT    Apply topically 2 (two) times daily.   TIRZEPATIDE  (ZEPBOUND ) 12.5 MG/0.5ML PEN    Inject 12.5 mg into the skin once a week.   TRIAMCINOLONE  (NASACORT ) 55 MCG/ACT AERO NASAL INHALER    Place 2 sprays into the nose daily.   VALACYCLOVIR  (VALTREX ) 1000 MG TABLET    Take 1 tablet (1,000 mg total) by mouth daily as needed.   VITAMIN D  PO    Take by mouth daily.  Modified Medications   No medications on file  Discontinued Medications   No medications on file    Allergies: Allergies[1]  Past Medical History: Past Medical History:  Diagnosis Date   Allergy    seasonal   Anemia    Anxiety    Asthma    Chronic urticaria  07/18/2023   Insomnia    Obesity (BMI 35.0-39.9 without comorbidity)    Oral herpes     Social History: Social History   Socioeconomic History   Marital status: Married    Spouse name: Not on file   Number of children: Not on file   Years of education: Not on file   Highest education level: Master's degree (e.g., MA, MS, MEng, MEd, MSW, MBA)  Occupational History   Not on file  Tobacco Use   Smoking status: Never   Smokeless tobacco: Never  Vaping Use   Vaping status: Never Used  Substance and Sexual Activity   Alcohol use: Not Currently   Drug use: Never   Sexual activity: Yes  Other Topics Concern   Not on file  Social History Narrative   Not on file   Social Drivers of Health   Tobacco Use: Low Risk (01/16/2024)   Patient History    Smoking Tobacco Use: Never    Smokeless Tobacco Use: Never    Passive Exposure: Not on file  Financial Resource Strain: Low Risk (07/16/2023)   Overall Financial Resource Strain (CARDIA)    Difficulty of Paying Living Expenses: Not hard at all  Food Insecurity: No Food Insecurity (07/16/2023)   Epic    Worried About Radiation Protection Practitioner of Food in the Last Year: Never true    Ran Out of Food in the Last Year: Never true  Transportation Needs: No Transportation Needs (07/16/2023)   Epic    Lack of Transportation (Medical): No    Lack of Transportation (Non-Medical): No  Physical Activity: Sufficiently Active (07/16/2023)   Exercise Vital Sign    Days of Exercise per Week: 5 days    Minutes of Exercise per Session: 30 min  Stress: No Stress Concern Present (07/16/2023)   [employer] of Occupational Health - Occupational Stress Questionnaire    Feeling of Stress: Only a little  Social Connections: Moderately Integrated (07/16/2023)   Social Connection and Isolation Panel    Frequency of Communication with Friends and Family: More than three times a week    Frequency of Social Gatherings with Friends and Family: Once a week    Attends Religious  Services: 1 to 4 times per year    Active Member of Clubs or Organizations: No    Attends Banker Meetings: Not on file    Marital Status: Married  Depression (PHQ2-9): Low Risk (03/26/2023)   Depression (PHQ2-9)    PHQ-2 Score: 2  Alcohol Screen: Low Risk (07/16/2023)   Alcohol Screen    Last Alcohol Screening Score (AUDIT): 1  Housing: Low Risk (07/16/2023)   Epic  Unable to Pay for Housing in the Last Year: No    Number of Times Moved in the Last Year: 0    Homeless in the Last Year: No  Utilities: Not At Risk (06/09/2023)   Received from [facility] Utilities    Threatened with loss of utilities: No  Health Literacy: Not on file       Assessment/Plan: 1. Patient prescribed Xolair  for chronic urticaria. Reviewed the medication with the patient, including the following:   Goals of therapy: Mechanism: IgG monoclonal antibody (recombinant DNA derived) which inhibits IgE binding to the high-affinity IgE receptor on mast cells and basophils.  Response to therapy: may take 3 to 6 months to determine efficacy. Discussed that patients generally feel improvement sooner than 3 months.  Side effects: anaphylaxis (0.1%) (black boxed warning), injection site reaction (45%), arthralgia (2.9% to 8%), headache (3% to 15%)   Plan:  - Patient will be scheduled for new start Xolair  appointment at Allergy and Asthma clinic when medication arrives to office.  - Rx will be triaged to [facility] for courier to Allergy and Asthma clinic.   I discussed the assessment and treatment plan with the patient. The patient was provided an opportunity to ask questions and all were answered. The patient agreed with the plan and demonstrated an understanding of the instructions.   The patient was advised to call back or seek an in-person evaluation if the symptoms worsen or if the condition fails to improve as anticipated.  I provided 15 minutes of  non-face-to-face time during this encounter.  Patient verbalizes understanding and agreement with plan.   Delon Brow, PharmD, CSP, AAHIVP, CPP Clinical Pharmacist Practitioner - Medication Therapy Disease Management/Specialty Pharmacy Services 01/23/2024, 10:16 AM    [1]  Allergies Allergen Reactions   Other     All nuts   Peanut-Containing Drug Products Hives   Sulfa Antibiotics Nausea And Vomiting

## 2024-01-23 NOTE — Telephone Encounter (Signed)
 Copay showing $0.00 w/ [employer] [place] Primary and [employer] Secondary, CAN FILL @ [facility]

## 2024-01-23 NOTE — Progress Notes (Signed)
 Specialty Pharmacy Initiation Note  Patient counseled in telephone visit 01/23/24   Brittney [name] is a 54 y.o. female who will be followed by the specialty pharmacy service for RxSp Allergy    Review of administration, indication, effectiveness, safety, potential side effects, storage/disposable, and missed dose instructions occurred today for patient's specialty medication(s) Omalizumab  (Xolair )     Patient/Caregiver did not have any additional questions or concerns.   Patient's therapy is appropriate to: Initiate    Goals Addressed             This Visit's Progress    Reduce signs and symptoms       Patient is initiating therapy. Patient will maintain adherence         Delon CHRISTELLA Brow Specialty Pharmacist

## 2024-01-23 NOTE — Telephone Encounter (Signed)
 Your request has been approved The case has been Approved from [id number] to [id number] Authorization Expiration01/15/2027

## 2024-01-26 ENCOUNTER — Other Ambulatory Visit: Payer: Self-pay

## 2024-02-02 ENCOUNTER — Ambulatory Visit

## 2024-02-09 ENCOUNTER — Ambulatory Visit

## 2024-02-12 ENCOUNTER — Encounter: Payer: Self-pay | Admitting: Internal Medicine

## 2024-02-12 ENCOUNTER — Other Ambulatory Visit: Payer: Self-pay | Admitting: Internal Medicine

## 2024-02-12 DIAGNOSIS — E669 Obesity, unspecified: Secondary | ICD-10-CM

## 2024-02-12 MED ORDER — ZEPBOUND 12.5 MG/0.5ML ~~LOC~~ SOAJ: 12.5000 mg | SUBCUTANEOUS | 0 refills | Status: AC

## 2024-02-16 ENCOUNTER — Ambulatory Visit

## 2024-02-25 ENCOUNTER — Ambulatory Visit

## 2024-02-26 ENCOUNTER — Ambulatory Visit: Admitting: Internal Medicine

## 2024-02-26 ENCOUNTER — Ambulatory Visit

## 2024-03-05 ENCOUNTER — Ambulatory Visit: Admitting: Internal Medicine

## 2024-05-10 IMAGING — MG MM DIGITAL SCREENING BILAT W/ TOMO AND CAD
8 series · 8 of 24 positions shown · non-contrast
Comparison: Previous exam(s).

CLINICAL DATA: Screening.

EXAM:
DIGITAL SCREENING BILATERAL MAMMOGRAM WITH TOMOSYNTHESIS AND CAD
TECHNIQUE: Bilateral screening digital craniocaudal and mediolateral oblique
mammograms were obtained. Bilateral screening digital breast
tomosynthesis was performed. The images were evaluated with
computer-aided detection.

[L MLO synth-2D]
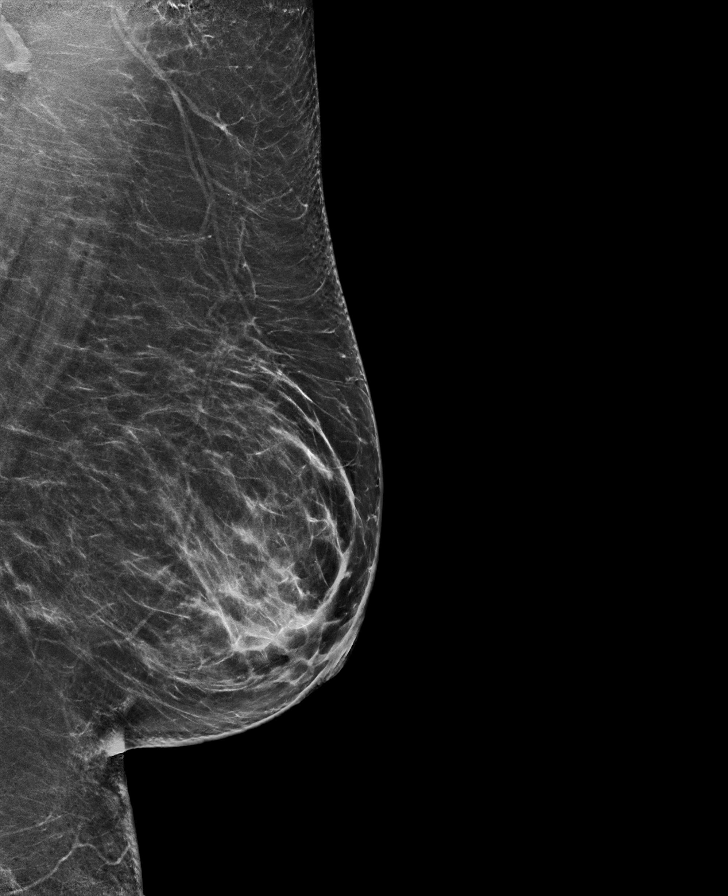

[R CC synth-2D]
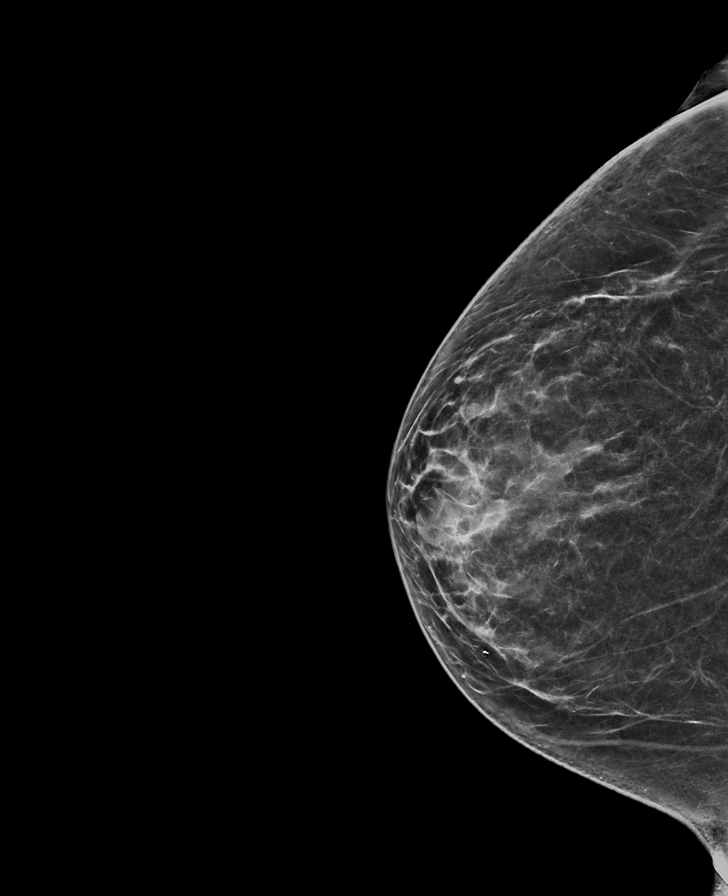

[L CC synth-2D]
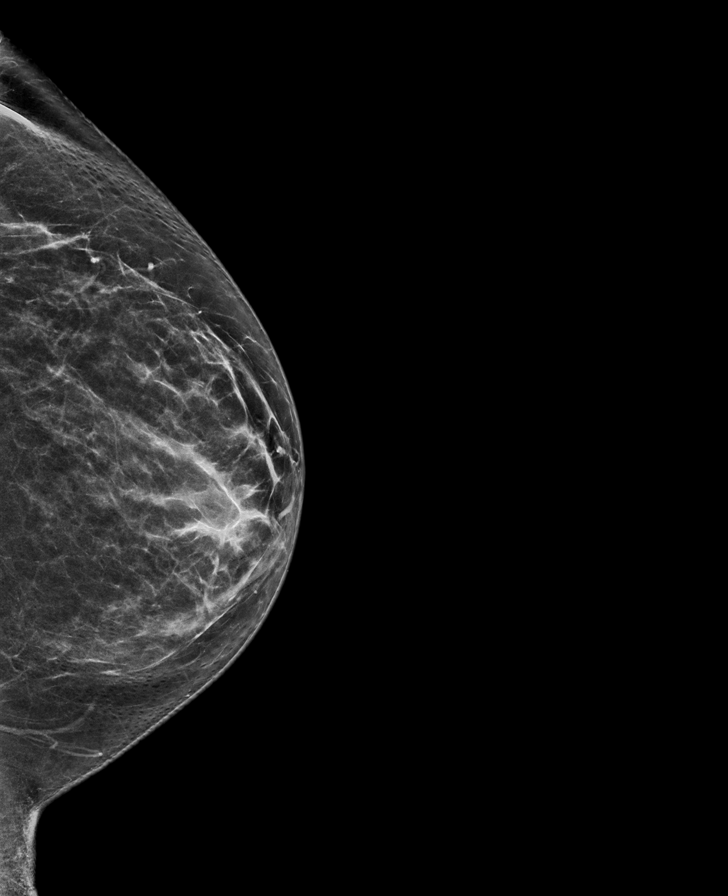

[R MLO synth-2D]
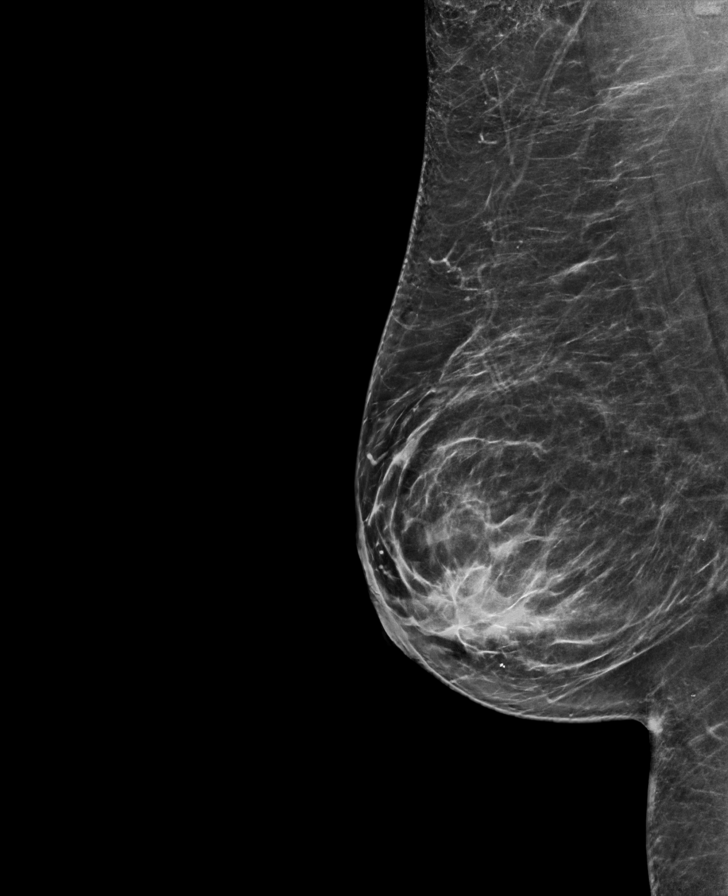

[R CC tomo · tomo slice 37/73.0]
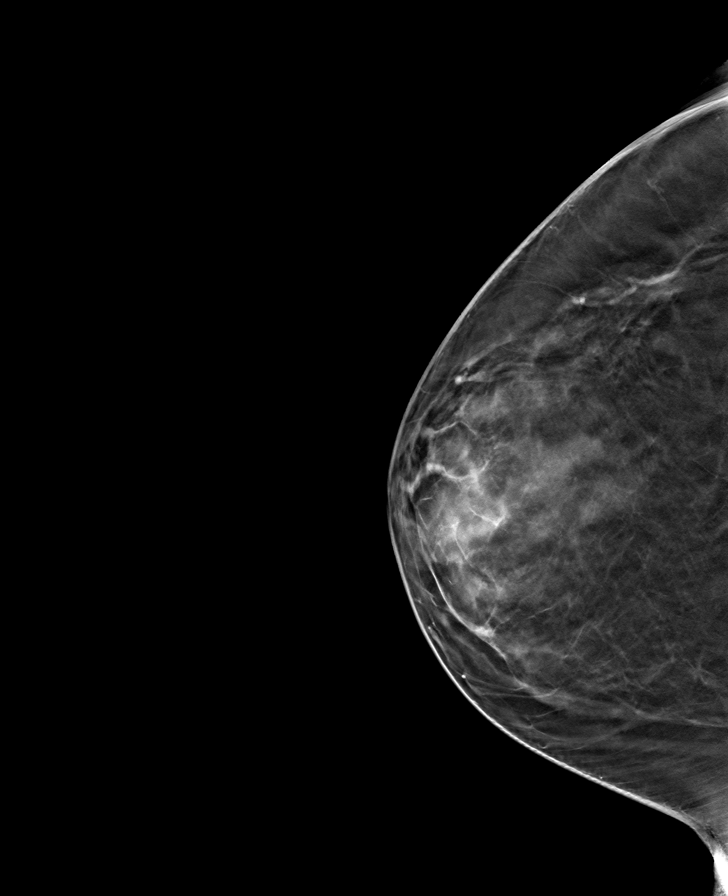

[L MLO tomo · tomo slice 43/86.0]
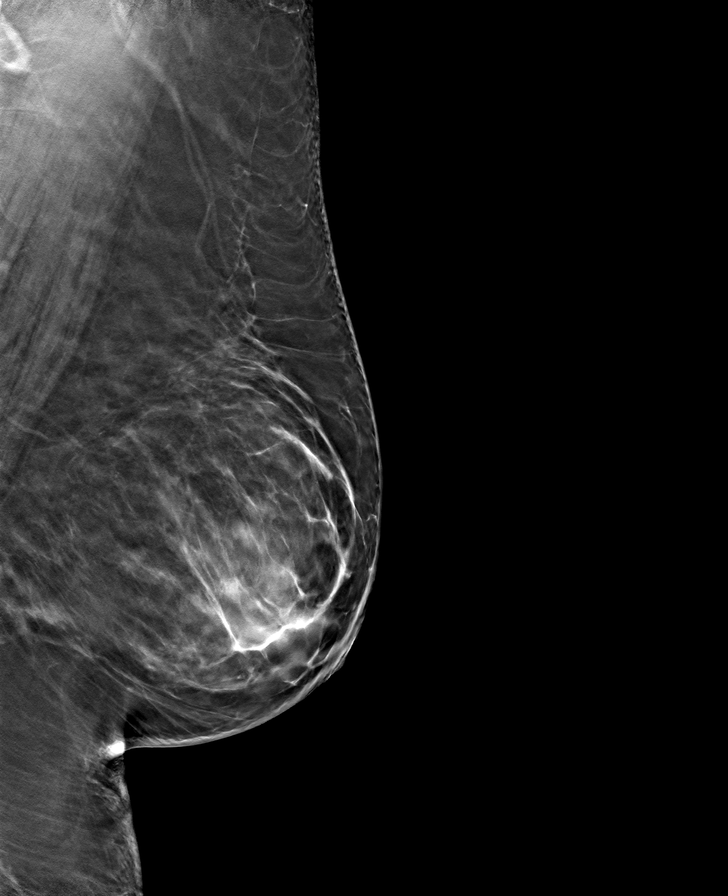

[L CC tomo · tomo slice 39/76.0]
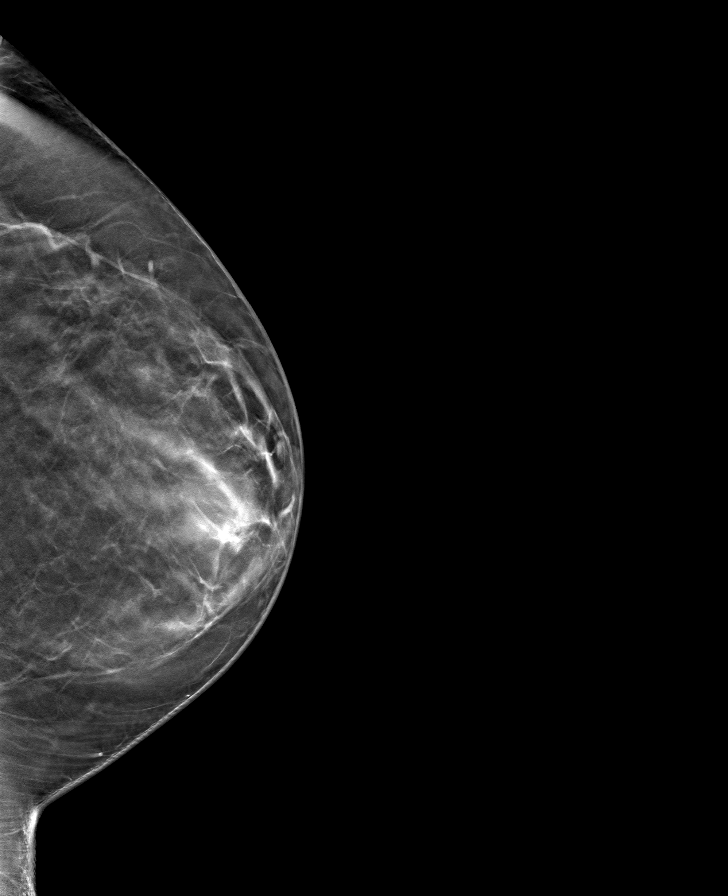

[R MLO tomo · tomo slice 43/84.0]
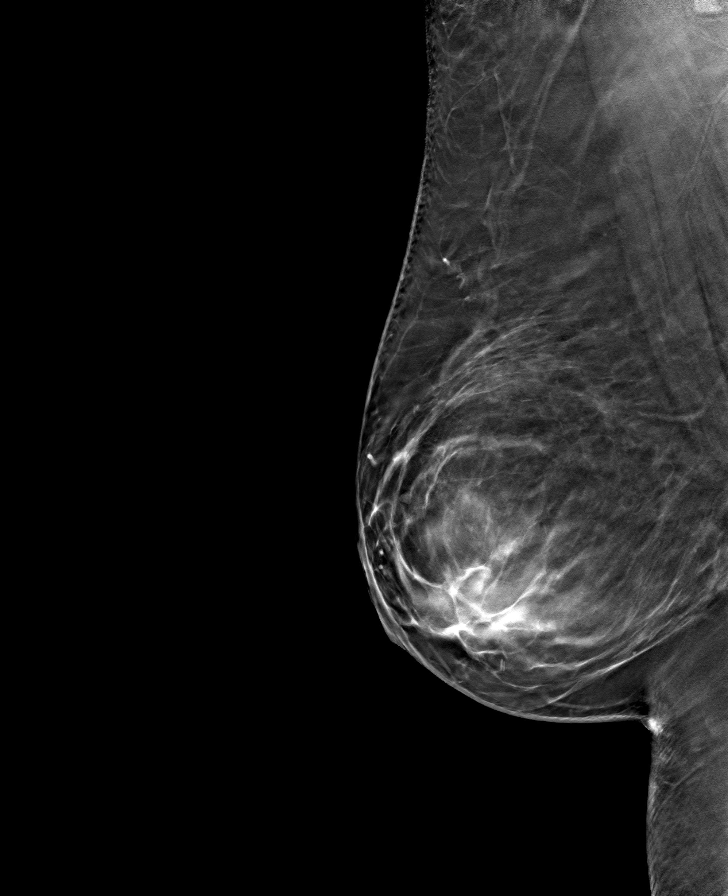

[8 of 24 positions shown; findings below may reference images not displayed]

ACR Breast Density Category b: There are scattered areas of
fibroglandular density.
FINDINGS: There are no findings suspicious for malignancy.
IMPRESSION: No mammographic evidence of malignancy. A result letter of this
screening mammogram will be mailed directly to the patient.

RECOMMENDATION:
Screening mammogram in one year. (Code:51-O-LD2)

BI-RADS CATEGORY  1: Negative.
